# Patient Record
Sex: Female | Born: 1985 | Race: Black or African American | Hispanic: No | Smoking: Former smoker
Health system: Southern US, Community
[De-identification: ages and names within clinical notes are randomized; demographics above are authoritative.]

## PROBLEM LIST (undated history)

## (undated) DIAGNOSIS — E079 Disorder of thyroid, unspecified: Secondary | ICD-10-CM

## (undated) DIAGNOSIS — E049 Nontoxic goiter, unspecified: Secondary | ICD-10-CM

## (undated) DIAGNOSIS — M797 Fibromyalgia: Secondary | ICD-10-CM

## (undated) DIAGNOSIS — I10 Essential (primary) hypertension: Secondary | ICD-10-CM

## (undated) DIAGNOSIS — J45909 Unspecified asthma, uncomplicated: Secondary | ICD-10-CM

## (undated) DIAGNOSIS — K589 Irritable bowel syndrome without diarrhea: Secondary | ICD-10-CM

## (undated) HISTORY — DX: Nontoxic goiter, unspecified: E04.9

## (undated) HISTORY — DX: Unspecified asthma, uncomplicated: J45.909

## (undated) HISTORY — PX: OTHER SURGICAL HISTORY: SHX169

---

## 2021-09-09 ENCOUNTER — Emergency Department (HOSPITAL_COMMUNITY)
Admission: EM | Admit: 2021-09-09 | Discharge: 2021-09-10 | Disposition: A | Discharge status: Home / Self Care | Payer: 59 | Attending: Emergency Medicine | Admitting: Emergency Medicine

## 2021-09-09 ENCOUNTER — Other Ambulatory Visit: Payer: Self-pay

## 2021-09-09 ENCOUNTER — Encounter (HOSPITAL_COMMUNITY): Payer: Self-pay

## 2021-09-09 ENCOUNTER — Emergency Department (HOSPITAL_COMMUNITY): Payer: 59

## 2021-09-09 DIAGNOSIS — E876 Hypokalemia: Secondary | ICD-10-CM | POA: Diagnosis not present

## 2021-09-09 DIAGNOSIS — N939 Abnormal uterine and vaginal bleeding, unspecified: Secondary | ICD-10-CM | POA: Diagnosis present

## 2021-09-09 DIAGNOSIS — D649 Anemia, unspecified: Secondary | ICD-10-CM | POA: Insufficient documentation

## 2021-09-09 DIAGNOSIS — I10 Essential (primary) hypertension: Secondary | ICD-10-CM | POA: Insufficient documentation

## 2021-09-09 DIAGNOSIS — Z79899 Other long term (current) drug therapy: Secondary | ICD-10-CM | POA: Insufficient documentation

## 2021-09-09 DIAGNOSIS — R109 Unspecified abdominal pain: Secondary | ICD-10-CM | POA: Insufficient documentation

## 2021-09-09 HISTORY — DX: Irritable bowel syndrome, unspecified: K58.9

## 2021-09-09 HISTORY — DX: Fibromyalgia: M79.7

## 2021-09-09 HISTORY — DX: Essential (primary) hypertension: I10

## 2021-09-09 HISTORY — DX: Disorder of thyroid, unspecified: E07.9

## 2021-09-09 LAB — URINALYSIS, ROUTINE W REFLEX MICROSCOPIC
Bacteria, UA: NONE SEEN
Bilirubin Urine: NEGATIVE
Glucose, UA: >=500 mg/dL — AB
Ketones, ur: NEGATIVE mg/dL
Leukocytes,Ua: NEGATIVE
Nitrite: NEGATIVE
Protein, ur: 30 mg/dL — AB
RBC / HPF: >50 RBC/hpf — ABNORMAL HIGH (ref 0–5)
Specific Gravity, Urine: 1.011 (ref 1.005–1.030)
pH: 8 (ref 5.0–8.0)

## 2021-09-09 LAB — CBC WITH DIFFERENTIAL/PLATELET
Abs Immature Granulocytes: 0.02 10*3/uL (ref 0.00–0.07)
Basophils Absolute: 0.1 10*3/uL (ref 0.0–0.1)
Basophils Relative: 1 %
Eosinophils Absolute: 0.2 10*3/uL (ref 0.0–0.5)
Eosinophils Relative: 2 %
HCT: 32.8 % — ABNORMAL LOW (ref 36.0–46.0)
Hemoglobin: 10.7 g/dL — ABNORMAL LOW (ref 12.0–15.0)
Immature Granulocytes: 0 %
Lymphocytes Relative: 25 %
Lymphs Abs: 2.4 10*3/uL (ref 0.7–4.0)
MCH: 30.2 pg (ref 26.0–34.0)
MCHC: 32.6 g/dL (ref 30.0–36.0)
MCV: 92.7 fL (ref 80.0–100.0)
Monocytes Absolute: 0.5 10*3/uL (ref 0.1–1.0)
Monocytes Relative: 5 %
Neutro Abs: 6.5 10*3/uL (ref 1.7–7.7)
Neutrophils Relative %: 67 %
Platelets: 405 10*3/uL — ABNORMAL HIGH (ref 150–400)
RBC: 3.54 MIL/uL — ABNORMAL LOW (ref 3.87–5.11)
RDW: 12.8 % (ref 11.5–15.5)
WBC: 9.6 10*3/uL (ref 4.0–10.5)
nRBC: 0 % (ref 0.0–0.2)

## 2021-09-09 LAB — BASIC METABOLIC PANEL
Anion gap: 5 (ref 5–15)
BUN: 12 mg/dL (ref 6–20)
CO2: 27 mmol/L (ref 22–32)
Calcium: 8.7 mg/dL — ABNORMAL LOW (ref 8.9–10.3)
Chloride: 106 mmol/L (ref 98–111)
Creatinine, Ser: 0.78 mg/dL (ref 0.44–1.00)
GFR, Estimated: >60 mL/min (ref >60)
Glucose, Bld: 131 mg/dL — ABNORMAL HIGH (ref 70–99)
Potassium: 3 mmol/L — ABNORMAL LOW (ref 3.5–5.1)
Sodium: 138 mmol/L (ref 135–145)

## 2021-09-09 LAB — PREGNANCY, URINE: Preg Test, Ur: NEGATIVE

## 2021-09-09 LAB — WET PREP, GENITAL
Clue Cells Wet Prep HPF POC: NONE SEEN
Sperm: NONE SEEN
Trich, Wet Prep: NONE SEEN
WBC, Wet Prep HPF POC: <10 — AB
Yeast Wet Prep HPF POC: NONE SEEN

## 2021-09-09 LAB — TYPE AND SCREEN
ABO/RH(D): O POS
Antibody Screen: NEGATIVE

## 2021-09-09 MED ORDER — POTASSIUM CHLORIDE CRYS ER 20 MEQ PO TBCR
40.0000 meq | EXTENDED_RELEASE_TABLET | Freq: Once | ORAL | Status: AC
  Administered 2021-09-09: 40 meq via ORAL
  Filled 2021-09-09: qty 2

## 2021-09-09 MED ORDER — FENTANYL CITRATE PF 50 MCG/ML IJ SOSY: 25.0000 ug | PREFILLED_SYRINGE | Freq: Once | INTRAMUSCULAR | Status: DC

## 2021-09-09 MED ORDER — IBUPROFEN 800 MG PO TABS
800.0000 mg | ORAL_TABLET | Freq: Once | ORAL | Status: AC
  Administered 2021-09-09: 800 mg via ORAL
  Filled 2021-09-09: qty 1

## 2021-09-09 MED ORDER — SODIUM CHLORIDE 0.9 % IV BOLUS
1000.0000 mL | Freq: Once | INTRAVENOUS | Status: AC
  Administered 2021-09-09: 1000 mL via INTRAVENOUS

## 2021-09-09 NOTE — ED Provider Notes (Signed)
Emergency Medicine Provider Triage Evaluation Note  Bonnie Saunders , a 35 y.o. female  was evaluated in triage.  Pt complains of pelvic pain, vaginal bleeding. She states that she has been having bleeding since October 16th. This is her normal start date of her menstrual cycle, however it started with just spotting for 2 weeks followed by significant amounts of bleeding so much so that she is now wearing depends with a pad and having to change several times a day. Additionally, she states that she developed pelvic pain 1 week ago that is suprapubic in nature. She denies any history of similar episodes. She is not on birth control. She also endorses dizziness and lightheadedness over the past few days.  Review of Systems  Positive: Vaginal bleeding, pelvic pain Negative: Fevers, chills  Physical Exam  BP (!) 149/95 (BP Location: Left Arm)   Pulse 97   Temp 98.2 F (36.8 C) (Oral)   Resp 16   SpO2 98%  Gen:   Awake, no distress   Resp:  Normal effort  MSK:   Moves extremities without difficulty  Other:    Medical Decision Making  Medically screening exam initiated at 7:06 PM.  Appropriate orders placed.  Bonnie Saunders was informed that the remainder of the evaluation will be completed by another provider, this initial triage assessment does not replace that evaluation, and the importance of remaining in the ED until their evaluation is complete.     Bonnie Saunders 09/09/21 1910    Tegeler, Canary Brim, MD 09/10/21 Rich Fuchs

## 2021-09-09 NOTE — ED Provider Notes (Addendum)
Bonnie Saunders   CSN: [id number] Arrival date & time: 09/09/21  1802     History Chief Complaint  Patient presents with   Vaginal Bleeding    Bonnie Saunders is a 35 y.o. female who presents to the emergency department with a 1 month history of vaginal bleeding.  She is typically very regular and has her menstrual cycle in the middle of the month each month.  Last month she initially started with some vaginal spotting which has since progressed to large heavy menstrual bleeding with clots.  This has been constant over the last couple weeks.  She reports associated lightheadedness, nausea, and vomiting.  She also is complaining of lower abdominal pain characterized a cramping sensation.  She is not on any form of birth control and denies any chance of being pregnant as she is homosexual.  She denies any loss of consciousness, fever, chills, cough, congestion.  She has an appointment with her OB/GYN on the 17th and has a family history of leiomyomas.   Vaginal Bleeding     Past Medical History:  Diagnosis Date   Fibromyalgia    Hypertension    IBS (irritable bowel syndrome)    Thyroid condition     There are no problems to display for this patient.   History reviewed. No pertinent surgical history.   OB History   No obstetric history on file.     History reviewed. No pertinent family history.  Social History   Tobacco Use   Smoking status: Never   Smokeless tobacco: Never  Vaping Use   Vaping Use: Never used  Substance Use Topics   Alcohol use: Never   Drug use: Never    Home Medications Prior to Admission medications   Medication Sig Start Date End Date Taking? Authorizing Provider  albuterol (VENTOLIN HFA) 108 (90 Base) MCG/ACT inhaler Inhale 1 puff into the lungs every 4 (four) hours as needed for wheezing or shortness of breath. 02/07/18  Yes [provider]  metFORMIN (GLUCOPHAGE) 850 MG tablet Take 850  mg by mouth daily with breakfast. 02/27/21  Yes [provider]  Multiple Vitamins-Calcium (ONE-A-DAY WOMENS FORMULA) TABS Take 1 tablet by mouth daily.   Yes [provider]  Naproxen Sodium 220 MG CAPS Take 1 capsule by mouth every 12 (twelve) hours as needed (pain, headache).   Yes [provider]  omeprazole (PRILOSEC) 20 MG capsule Take 20 mg by mouth daily. 03/29/20  Yes [provider]  pregabalin (LYRICA) 50 MG capsule Take 50 mg by mouth 2 (two) times daily. 12/17/19  Yes [provider]  sertraline (ZOLOFT) 25 MG tablet Take 25 mg by mouth daily. 08/25/21  Yes [provider]  tiZANidine (ZANAFLEX) 4 MG tablet Take 4 mg by mouth at bedtime as needed for muscle spasms. 08/11/21  Yes [provider]  verapamil (CALAN-SR) 180 MG CR tablet Take 180 mg by mouth daily. 08/25/21  Yes [provider]    Allergies    Peanut (diagnostic), Shellfish allergy, Norco [hydrocodone-acetaminophen], Penicillins, and Skelaxin [metaxalone]  Review of Systems   Review of Systems  Genitourinary:  Positive for vaginal bleeding.  All other systems reviewed and are negative.  Physical Exam Updated Vital Signs BP 129/90   Pulse 84   Temp 98.2 F (36.8 C) (Oral)   Resp 18   SpO2 100%   Physical Exam Vitals and nursing Saunders reviewed. Exam conducted with a chaperone present.  Constitutional:  General: She is not in acute distress.    Appearance: Normal appearance.  HENT:     Head: Normocephalic and atraumatic.  Eyes:     General:        Right eye: No discharge.        Left eye: No discharge.  Cardiovascular:     Comments: Regular rate and rhythm.  S1/S2 are distinct without any evidence of murmur, rubs, or gallops.  Radial pulses are 2+ bilaterally.  Dorsalis pedis pulses are 2+ bilaterally.  No evidence of pedal edema. Pulmonary:     Comments: Clear to auscultation bilaterally.  Normal effort.  No respiratory distress.  No  evidence of wheezes, rales, or rhonchi heard throughout. Abdominal:     General: Abdomen is flat. Bowel sounds are normal. There is no distension.     Tenderness: There is no guarding or rebound.     Comments: Obese abdomen. Moderate lower abdominal tenderness.  Genitourinary:    Comments: External genitalia is normal.  No labial tenderness, erythema, or or edema.  No obvious lesions.  Vaginal canal was normal without any signs of tenderness, bleeding, or discharge.  There was a small clot at the cervical os. Mild amount of bleeding. Cervical os was nonfriable nonerythematous without any overt lesions or significant discharge.  Bimanual exam revealed right adnexal tenderness. Musculoskeletal:        General: Normal range of motion.     Cervical back: Neck supple.  Skin:    General: Skin is warm and dry.     Findings: No rash.  Neurological:     General: No focal deficit present.     Mental Status: She is alert.  Psychiatric:        Mood and Affect: Mood normal.        Behavior: Behavior normal.    ED Results / Procedures / Treatments   Labs (all labs ordered are listed, but only abnormal results are displayed) Labs Reviewed  WET PREP, GENITAL - Abnormal; Notable for the following components:      Result Value   WBC, Wet Prep HPF POC <10 (*)    All other components within normal limits  CBC WITH DIFFERENTIAL/PLATELET - Abnormal; Notable for the following components:   RBC 3.54 (*)    Hemoglobin 10.7 (*)    HCT 32.8 (*)    Platelets 405 (*)    All other components within normal limits  BASIC METABOLIC PANEL - Abnormal; Notable for the following components:   Potassium 3.0 (*)    Glucose, Bld 131 (*)    Calcium 8.7 (*)    All other components within normal limits  URINALYSIS, ROUTINE W REFLEX MICROSCOPIC - Abnormal; Notable for the following components:   APPearance HAZY (*)    Glucose, UA >=500 (*)    Hgb urine dipstick LARGE (*)    Protein, ur 30 (*)    RBC / HPF >50 (*)     All other components within normal limits  PREGNANCY, URINE  TYPE AND SCREEN  ABO/RH  GC/CHLAMYDIA PROBE AMP ([place]) NOT AT [facility]    EKG None  Radiology US Transvaginal Non-OB  Result Date: 09/09/2021 CLINICAL DATA:  Pelvic pain, continuous vaginal bleeding x1 month. LMP 08/13/2021 EXAM: TRANSABDOMINAL AND TRANSVAGINAL ULTRASOUND OF PELVIS DOPPLER ULTRASOUND OF OVARIES TECHNIQUE: Both transabdominal and transvaginal ultrasound examinations of the pelvis were performed. Transabdominal technique was performed for global imaging of the pelvis including uterus, ovaries, adnexal regions, and pelvic cul-de-sac. It was necessary  to proceed with endovaginal exam following the transabdominal exam to visualize the endometrium and ovaries. Color and duplex Doppler ultrasound was utilized to evaluate blood flow to the ovaries. COMPARISON:  None. FINDINGS: Uterus Measurements: 10.5 x 3.9 x 5.1 cm = volume: 108 mL. The cervix is expanded and there is heterogeneous relatively hypovascular material expanding the endocervical canal. The ovarian stroma is not well delineated and, together, un underlying hypovascular cervical mass should be considered. Alternatively, this may represent a combination of debris and blood product within the endocervical canal. The heterogeneous material does not appear to extend beyond the margin of the cervix on this limited evaluation. No intrauterine masses are seen. The uterus is anteverted. Endometrium Thickness: 16 mm.  No focal abnormality visualized. Right ovary Measurements: 4.6 x 3.4 x 4.0 cm = volume: 33 mL. A simple cyst is identified measuring 4.0 x 2.8 x 3.3 cm. Left ovary Measurements: 3.3 x 2.0 x 2.3 cm = volume: 8 mL. Normal appearance/no adnexal mass. Pulsed Doppler evaluation of both ovaries demonstrates normal low-resistance arterial and venous waveforms. Other findings Mild simple appearing free fluid noted within the pelvis. IMPRESSION: Heterogeneous,  avascular, expansile material within the endocervical canal. While this may simply represent a combination of blood and debris within the endocervical canal, a hypovascular mass could appear similarly. Correlation with clinical examination and possible tissue sampling is recommended. 4 cm right ovarian benign functional cyst. No follow-up imaging is recommended. Reference: Radiology 2019 Nov;293(2):359-371 Electronically Signed   By: Helyn Numbers M.D.   On: 09/09/2021 23:34   US Pelvis Complete  Result Date: 09/09/2021 CLINICAL DATA:  Pelvic pain, continuous vaginal bleeding x1 month. LMP 08/13/2021 EXAM: TRANSABDOMINAL AND TRANSVAGINAL ULTRASOUND OF PELVIS DOPPLER ULTRASOUND OF OVARIES TECHNIQUE: Both transabdominal and transvaginal ultrasound examinations of the pelvis were performed. Transabdominal technique was performed for global imaging of the pelvis including uterus, ovaries, adnexal regions, and pelvic cul-de-sac. It was necessary to proceed with endovaginal exam following the transabdominal exam to visualize the endometrium and ovaries. Color and duplex Doppler ultrasound was utilized to evaluate blood flow to the ovaries. COMPARISON:  None. FINDINGS: Uterus Measurements: 10.5 x 3.9 x 5.1 cm = volume: 108 mL. The cervix is expanded and there is heterogeneous relatively hypovascular material expanding the endocervical canal. The ovarian stroma is not well delineated and, together, un underlying hypovascular cervical mass should be considered. Alternatively, this may represent a combination of debris and blood product within the endocervical canal. The heterogeneous material does not appear to extend beyond the margin of the cervix on this limited evaluation. No intrauterine masses are seen. The uterus is anteverted. Endometrium Thickness: 16 mm.  No focal abnormality visualized. Right ovary Measurements: 4.6 x 3.4 x 4.0 cm = volume: 33 mL. A simple cyst is identified measuring 4.0 x 2.8 x 3.3 cm.  Left ovary Measurements: 3.3 x 2.0 x 2.3 cm = volume: 8 mL. Normal appearance/no adnexal mass. Pulsed Doppler evaluation of both ovaries demonstrates normal low-resistance arterial and venous waveforms. Other findings Mild simple appearing free fluid noted within the pelvis. IMPRESSION: Heterogeneous, avascular, expansile material within the endocervical canal. While this may simply represent a combination of blood and debris within the endocervical canal, a hypovascular mass could appear similarly. Correlation with clinical examination and possible tissue sampling is recommended. 4 cm right ovarian benign functional cyst. No follow-up imaging is recommended. Reference: Radiology 2019 Nov;293(2):359-371 Electronically Signed   By: Helyn Numbers M.D.   On: 09/09/2021 23:34   [place] Art/Ven Flow  Abd Pelv Doppler  Result Date: 09/09/2021 CLINICAL DATA:  Pelvic pain, continuous vaginal bleeding x1 month. LMP 08/13/2021 EXAM: TRANSABDOMINAL AND TRANSVAGINAL ULTRASOUND OF PELVIS DOPPLER ULTRASOUND OF OVARIES TECHNIQUE: Both transabdominal and transvaginal ultrasound examinations of the pelvis were performed. Transabdominal technique was performed for global imaging of the pelvis including uterus, ovaries, adnexal regions, and pelvic cul-de-sac. It was necessary to proceed with endovaginal exam following the transabdominal exam to visualize the endometrium and ovaries. Color and duplex Doppler ultrasound was utilized to evaluate blood flow to the ovaries. COMPARISON:  None. FINDINGS: Uterus Measurements: 10.5 x 3.9 x 5.1 cm = volume: 108 mL. The cervix is expanded and there is heterogeneous relatively hypovascular material expanding the endocervical canal. The ovarian stroma is not well delineated and, together, un underlying hypovascular cervical mass should be considered. Alternatively, this may represent a combination of debris and blood product within the endocervical canal. The heterogeneous material does not appear  to extend beyond the margin of the cervix on this limited evaluation. No intrauterine masses are seen. The uterus is anteverted. Endometrium Thickness: 16 mm.  No focal abnormality visualized. Right ovary Measurements: 4.6 x 3.4 x 4.0 cm = volume: 33 mL. A simple cyst is identified measuring 4.0 x 2.8 x 3.3 cm. Left ovary Measurements: 3.3 x 2.0 x 2.3 cm = volume: 8 mL. Normal appearance/no adnexal mass. Pulsed Doppler evaluation of both ovaries demonstrates normal low-resistance arterial and venous waveforms. Other findings Mild simple appearing free fluid noted within the pelvis. IMPRESSION: Heterogeneous, avascular, expansile material within the endocervical canal. While this may simply represent a combination of blood and debris within the endocervical canal, a hypovascular mass could appear similarly. Correlation with clinical examination and possible tissue sampling is recommended. 4 cm right ovarian benign functional cyst. No follow-up imaging is recommended. Reference: Radiology 2019 Nov;293(2):359-371 Electronically Signed   By: Helyn Numbers M.D.   On: 09/09/2021 23:34    Procedures Procedures   Medications Ordered in ED Medications  sodium chloride 0.9 % bolus 1,000 mL (1,000 mLs Intravenous New Bag/Given 09/09/21 2123)  ibuprofen (ADVIL) tablet 800 mg (800 mg Oral Given 09/09/21 2220)  potassium chloride SA (KLOR-CON) CR tablet 40 mEq (40 mEq Oral Given 09/09/21 2220)    ED Course  I have reviewed the triage vital signs and the nursing notes.  Pertinent labs & imaging results that were available during my care of the patient were reviewed by me and considered in my medical decision making (see chart for details).    MDM Rules/Calculators/A&P                          Bonnie Saunders is a 35 y.o. female who presents the emergency department with a 1 month history of vaginal bleeding.  Patient not tachycardic or hypotensive.  I have a low suspicion for ectopic pregnancy at this time. She  is otherwise well-appearing and stable.  Initial work-up was ordered in triage to include CBC, BMP, UA, pregnancy, type and screen, and pelvic ultrasound. Given her family history of leiomyomas certainly a possibility. I have a low suspicion for life-threatening coagulopathy, trauma or serious bacterial infection at this time.  CBC showed mild anemia.  CMP showed hypokalemia.  This was repleted in the emergency department today.  Pregnancy negative.  Wet prep without any abnormalities.  Urinalysis showed large amount of blood in the setting of vaginal bleeding. Orthostatics negative.  Attempted to give her a liter of fluid however  became clogged and the entire liter did not run.  She approximately 250 mL.  Patient does state that she is feeling somewhat better. Wet prep was negative. G/C is pending. I have a low suspicion for STI infection at this time. Shared decision making was done with patient and she wishes to defer treatment. I asked if she would like to stay to allow the bag of fluid to run patient wishes to go home. Pelvic US revealed 4 cm right ovarian cyst. No obvious cause of her bleeding.  Given the clinical scenario, I will have her follow-up with her OB/GYN on the 17th.  She is not in need of emergent transfusion at this time. Strict return precautions were given.  Instructed the patient to you take 600 mg of ibuprofen every 6 hours for the next several days to help with pelvic cramping and bleeding.  Patient and partner expressed full understanding.  All questions or concerns addressed.     Final Clinical Impression(s) / ED Diagnoses Final diagnoses:  Vaginal bleeding    Rx / DC Orders ED Discharge Orders     None        Teressa Lower, PA-C 09/10/21 0014    Teressa Lower, PA-C 09/10/21 Glena Norfolk    Mancel Bale, MD 09/10/21 [phone]

## 2021-09-09 NOTE — ED Triage Notes (Signed)
Pt c/o vaginal bleeding and pelvic pain since the 16th of November. States the bleeding has become more severe. Reports going through six pads and two depends today. States she is also experiencing increased lightheadedness and dizziness.

## 2021-09-10 NOTE — Discharge Instructions (Addendum)
Your ultrasound revealed you have a right-sided ovarian cyst.  Please take 600 mg ibuprofen every 6 hours for pelvic cramps and bleeding.  I would also start taking iron supplementation. Please keep your OB/GYN appointment for the 17th.  I would advise you call them on Monday deceiving get in sooner.  Please turn to the emergency department sooner if you experience worsening lightheadedness, trouble breathing, shortness of breath with exertion, loss of consciousness, worsening bleeding with severe clots, or any other concerns you might have.

## 2021-09-11 LAB — GC/CHLAMYDIA PROBE AMP (~~LOC~~) NOT AT ARMC
Chlamydia: NEGATIVE
Comment: NEGATIVE
Comment: NORMAL
Neisseria Gonorrhea: NEGATIVE

## 2022-03-04 ENCOUNTER — Emergency Department (HOSPITAL_COMMUNITY): Payer: 59

## 2022-03-04 ENCOUNTER — Encounter (HOSPITAL_COMMUNITY): Payer: Self-pay | Admitting: Emergency Medicine

## 2022-03-04 ENCOUNTER — Emergency Department (HOSPITAL_COMMUNITY)
Admission: EM | Admit: 2022-03-04 | Discharge: 2022-03-04 | Disposition: A | Discharge status: Home / Self Care | Payer: 59 | Attending: Emergency Medicine | Admitting: Emergency Medicine

## 2022-03-04 ENCOUNTER — Other Ambulatory Visit: Payer: Self-pay

## 2022-03-04 DIAGNOSIS — R000 Tachycardia, unspecified: Secondary | ICD-10-CM | POA: Insufficient documentation

## 2022-03-04 DIAGNOSIS — U071 COVID-19: Secondary | ICD-10-CM | POA: Diagnosis not present

## 2022-03-04 DIAGNOSIS — R059 Cough, unspecified: Secondary | ICD-10-CM | POA: Diagnosis present

## 2022-03-04 DIAGNOSIS — Z91010 Allergy to peanuts: Secondary | ICD-10-CM | POA: Insufficient documentation

## 2022-03-04 LAB — RESP PANEL BY RT-PCR (FLU A&B, COVID) ARPGX2
Influenza A by PCR: NEGATIVE
Influenza B by PCR: NEGATIVE
SARS Coronavirus 2 by RT PCR: POSITIVE — AB

## 2022-03-04 MED ORDER — ALBUTEROL SULFATE HFA 108 (90 BASE) MCG/ACT IN AERS
2.0000 | INHALATION_SPRAY | RESPIRATORY_TRACT | Status: DC | PRN
  Administered 2022-03-04: 2 via RESPIRATORY_TRACT
  Filled 2022-03-04: qty 6.7

## 2022-03-04 MED ORDER — SODIUM CHLORIDE 0.9 % IV BOLUS: 1000.0000 mL | Freq: Once | INTRAVENOUS | Status: DC

## 2022-03-04 MED ORDER — KETOROLAC TROMETHAMINE 15 MG/ML IJ SOLN
15.0000 mg | Freq: Once | INTRAMUSCULAR | Status: AC
  Administered 2022-03-04: 15 mg via INTRAMUSCULAR
  Filled 2022-03-04: qty 1

## 2022-03-04 MED ORDER — KETOROLAC TROMETHAMINE 15 MG/ML IJ SOLN: 15.0000 mg | Freq: Once | INTRAMUSCULAR | Status: DC

## 2022-03-04 MED ORDER — BENZONATATE 100 MG PO CAPS: 100.0000 mg | ORAL_CAPSULE | Freq: Three times a day (TID) | ORAL | 0 refills | Status: DC

## 2022-03-04 NOTE — ED Provider Notes (Addendum)
?[facility] EMERGENCY DEPARTMENT ?Provider Note ? ? ?CSN: [id number] ?Arrival date & time: 03/04/22  0441 ? ?  ? ?History ? ?Chief Complaint  ?Patient presents with  ? Shortness of Breath  ? ? ?Bonnie Saunders is a 36 y.o. female presenting due to flu symptoms.  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  Complaining of congestion, cold chills, cough, chest tightness and body aches.  She says it feels exactly like when she had the flu. ? ?  ?Home Medications ?Prior to Admission medications   ?Medication Sig Start Date End Date Taking? Authorizing Provider  ?albuterol (VENTOLIN HFA) 108 (90 Base) MCG/ACT inhaler Inhale 1 puff into the lungs every 4 (four) hours as needed for wheezing or shortness of breath. 02/07/18   [provider]  ?metFORMIN (GLUCOPHAGE) 850 MG tablet Take 850 mg by mouth daily with breakfast. 02/27/21   [provider]  ?Multiple Vitamins-Calcium (ONE-A-DAY WOMENS FORMULA) TABS Take 1 tablet by mouth daily.    [provider]  ?Naproxen Sodium 220 MG CAPS Take 1 capsule by mouth every 12 (twelve) hours as needed (pain, headache).    [provider]  ?omeprazole (PRILOSEC) 20 MG capsule Take 20 mg by mouth daily. 03/29/20   [provider]  ?pregabalin (LYRICA) 50 MG capsule Take 50 mg by mouth 2 (two) times daily. 12/17/19   [provider]  ?sertraline (ZOLOFT) 25 MG tablet Take 25 mg by mouth daily. 08/25/21   [provider]  ?tiZANidine (ZANAFLEX) 4 MG tablet Take 4 mg by mouth at bedtime as needed for muscle spasms. 08/11/21   [provider]  ?verapamil (CALAN-SR) 180 MG CR tablet Take 180 mg by mouth daily. 08/25/21   [provider]  ?   ? ?Allergies    ?Peanut (diagnostic), Shellfish allergy, Norco [hydrocodone-acetaminophen], Penicillins, and Skelaxin [metaxalone]   ? ?Review of Systems   ?Review of Systems  ?Constitutional:   Positive for chills. Negative for fever.  ?Respiratory:  Positive for chest tightness. Negative for shortness of breath.   ?Gastrointestinal:  Negative for diarrhea, nausea and vomiting.  ?Musculoskeletal:  Positive for myalgias.  ? ?Physical Exam ?Updated Vital Signs ?BP (!) 153/96 (BP Location: Right Arm)   Pulse (!) 112   Temp 98.8 ?F (37.1 ?C) (Oral)   Resp 18   Wt 102.5 kg   LMP 02/21/2022 (Approximate)   SpO2 99%  ?Physical Exam ?Vitals and nursing note reviewed.  ?Constitutional:   ?   General: She is not in acute distress. ?   Appearance: Normal appearance. She is diaphoretic. She is not ill-appearing.  ?HENT:  ?   Head: Normocephalic and atraumatic.  ?   Mouth/Throat:  ?   Mouth: Mucous membranes are moist.  ?   Pharynx: Oropharynx is clear.  ?Eyes:  ?   General: No scleral icterus. ?   Conjunctiva/sclera: Conjunctivae normal.  ?Cardiovascular:  ?   Rate and Rhythm: Regular rhythm. Tachycardia present.  ?Pulmonary:  ?   Effort: Pulmonary effort is normal. No respiratory distress.  ?   Breath sounds: No decreased breath sounds.  ?Skin: ?   General: Skin is warm.  ?   Findings: No rash.  ?Neurological:  ?   Mental Status: She is alert.  ?Psychiatric:     ?   Mood and Affect: Mood normal.  ? ? ?ED Results / Procedures / Treatments   ?Labs ?(all  labs ordered are listed, but only abnormal results are displayed) ?Labs Reviewed  ?RESP PANEL BY RT-PCR (FLU A&B, COVID) ARPGX2 - Abnormal; Notable for the following components:  ?    Result Value  ? SARS Coronavirus 2 by RT PCR POSITIVE (*)   ? All other components within normal limits  ? ? ?EKG ?None ? ?Radiology ?DG Chest 2 View ? ?Result Date: 03/04/2022 ?CLINICAL DATA:  COVID positive with shortness of breath, coughing and chest pain. EXAM: CHEST - 2 VIEW COMPARISON:  None Available. FINDINGS: The heart size and mediastinal contours are within normal limits. Both lungs are clear with mildly elevated right hemidiaphragm. The visualized skeletal structures are  unremarkable. IMPRESSION: No active cardiopulmonary disease. Electronically Signed   By: Telford Nab M.D.   On: 03/04/2022 05:34   ? ?Procedures ?Procedures  ? ? ?Medications Ordered in ED ?Medications  ?albuterol (VENTOLIN HFA) 108 (90 Base) MCG/ACT inhaler 2 puff (2 puffs Inhalation Given 03/04/22 0457)  ?ketorolac (TORADOL) 15 MG/ML injection 15 mg (has no administration in time range)  ? ? ?ED Course/ Medical Decision Making/ A&P ?  ?                        ?Medical Decision Making ?Amount and/or Complexity of Data Reviewed ?Radiology: ordered. ? ?Risk ?Prescription drug management. ? ? ?36 year old female presenting due to URI symptoms.  No known COVID exposure and she began to feel ill the following day. ? ?Imaging: Chest x-ray negative ? ?Testing: COVID-positive ? ?Treatment: Given albuterol inhaler and Toradol and says that this made her feel better ? ?MDM/disposition: Patient is stable for discharge home with a work note.  She is low likelihood Wells PE score.  Believe tachycardia and chest tightness is in the context of URI due to other symptoms and lack of risk factors.  She is agreeable to discharge with a work note and will not return to work until Friday. ? ?Strict return precautions were discussed and patient will return with worsening symptoms. ? ? ?Final Clinical Impression(s) / ED Diagnoses ?Final diagnoses:  ?COVID-19  ? ? ?Rx / DC Orders ?ED Discharge Orders   ? ?      Ordered  ?  benzonatate (TESSALON) 100 MG capsule  Every 8 hours       ? 03/04/22 0711  ? ?  ?  ? ?  ? ?Results and diagnoses were explained to the patient. Return precautions discussed in full. Patient had no additional questions and expressed complete understanding. ? ? ?This chart was dictated using voice recognition software.  Despite best efforts to proofread,  errors can occur which can change the documentation meaning.  ?  Rhae Hammock, PA-C ?03/04/22 [id number] ? ?Offered antiviral, declined need for additional treatment   ?  ?  ?Rhae Hammock, PA-C ?03/04/22 [id number] ? ?  ?Valarie Merino, MD ?03/06/22 1309 ? ?

## 2022-03-04 NOTE — Discharge Instructions (Signed)
Please use over-the-counter medications to treat your symptoms.  Mucinex is a great choice for congestion, Delsym may help with cough.  DayQuil and NyQuil are good options for overall flu symptoms. ? ?Be sure to stay hydrated and pick up the medication for cough as needed. ?

## 2022-03-04 NOTE — ED Triage Notes (Signed)
Cough (nonproductive), chest congestions, body aches, chest wall tenderness, and fever x 2 day. Positive Covid test at home. ?H/o fibromyalgia, prediabetes, htn, asthma (has used 4puffs from rescue inhaler today). ? ?

## 2022-07-27 ENCOUNTER — Ambulatory Visit
Admission: EM | Admit: 2022-07-27 | Discharge: 2022-07-27 | Disposition: A | Discharge status: Home / Self Care | Payer: 59 | Attending: Urgent Care | Admitting: Urgent Care

## 2022-07-27 DIAGNOSIS — Z7951 Long term (current) use of inhaled steroids: Secondary | ICD-10-CM | POA: Insufficient documentation

## 2022-07-27 DIAGNOSIS — J209 Acute bronchitis, unspecified: Secondary | ICD-10-CM | POA: Insufficient documentation

## 2022-07-27 DIAGNOSIS — Z20822 Contact with and (suspected) exposure to covid-19: Secondary | ICD-10-CM | POA: Insufficient documentation

## 2022-07-27 DIAGNOSIS — E119 Type 2 diabetes mellitus without complications: Secondary | ICD-10-CM | POA: Insufficient documentation

## 2022-07-27 DIAGNOSIS — Z7984 Long term (current) use of oral hypoglycemic drugs: Secondary | ICD-10-CM | POA: Diagnosis not present

## 2022-07-27 DIAGNOSIS — Z79899 Other long term (current) drug therapy: Secondary | ICD-10-CM | POA: Insufficient documentation

## 2022-07-27 MED ORDER — PROMETHAZINE-DM 6.25-15 MG/5ML PO SYRP: 2.5000 mL | ORAL_SOLUTION | Freq: Three times a day (TID) | ORAL | 0 refills | Status: DC | PRN

## 2022-07-27 MED ORDER — PREDNISONE 50 MG PO TABS: 50.0000 mg | ORAL_TABLET | Freq: Every day | ORAL | 0 refills | Status: DC

## 2022-07-27 NOTE — ED Provider Notes (Signed)
[facility]  Note:  This document was prepared using Systems analyst and may include unintentional dictation errors.  MRN: [id number] DOB: 08/11/1986  Subjective:   Bonnie Saunders is a 36 y.o. female presenting for 5-day history of acute onset persistent shortness of breath, chest tightness, chest pain and a dry cough.  Patient had an at-home negative COVID test.  Would like a repeat.  She does have a history of bronchitis and is seasonal.  She is not a smoker.  No history of asthma.  She is a type II diabetic treated without insulin.  No current facility-administered medications for this encounter.  Current Outpatient Medications:    albuterol (VENTOLIN HFA) 108 (90 Base) MCG/ACT inhaler, Inhale 1 puff into the lungs every 4 (four) hours as needed for wheezing or shortness of breath., Disp: , Rfl:    metFORMIN (GLUCOPHAGE) 850 MG tablet, Take 850 mg by mouth daily with breakfast., Disp: , Rfl:    Multiple Vitamins-Calcium (ONE-A-DAY WOMENS FORMULA) TABS, Take 1 tablet by mouth daily., Disp: , Rfl:    omeprazole (PRILOSEC) 20 MG capsule, Take 20 mg by mouth daily., Disp: , Rfl:    pregabalin (LYRICA) 50 MG capsule, Take 50 mg by mouth 2 (two) times daily., Disp: , Rfl:    sertraline (ZOLOFT) 25 MG tablet, Take 25 mg by mouth daily., Disp: , Rfl:    verapamil (CALAN-SR) 180 MG CR tablet, Take 180 mg by mouth daily., Disp: , Rfl:    benzonatate (TESSALON) 100 MG capsule, Take 1 capsule (100 mg total) by mouth every 8 (eight) hours., Disp: 21 capsule, Rfl: 0   Naproxen Sodium 220 MG CAPS, Take 1 capsule by mouth every 12 (twelve) hours as needed (pain, headache)., Disp: , Rfl:    tiZANidine (ZANAFLEX) 4 MG tablet, Take 4 mg by mouth at bedtime as needed for muscle spasms., Disp: , Rfl:    Allergies  Allergen Reactions   Peanut (Diagnostic) Anaphylaxis   Norco [Hydrocodone-Acetaminophen] Hives   Penicillins Hives   Skelaxin [Metaxalone] Hives     Past Medical History:  Diagnosis Date   Fibromyalgia    Hypertension    IBS (irritable bowel syndrome)    Thyroid condition      History reviewed. No pertinent surgical history.  History reviewed. No pertinent family history.  Social History   Tobacco Use   Smoking status: Never   Smokeless tobacco: Never  Vaping Use   Vaping Use: Never used  Substance Use Topics   Alcohol use: Never   Drug use: Never    ROS   Objective:   Vitals: BP (!) 149/90 (BP Location: Right Arm)   Pulse 85   Temp 98.3 F (36.8 C) (Oral)   Resp (!) 98   LMP 07/08/2022   Physical Exam Constitutional:      General: She is not in acute distress.    Appearance: Normal appearance. She is well-developed. She is obese. She is not ill-appearing, toxic-appearing or diaphoretic.  HENT:     Head: Normocephalic and atraumatic.     Nose: Nose normal.     Mouth/Throat:     Mouth: Mucous membranes are moist.  Eyes:     General: No scleral icterus.       Right eye: No discharge.        Left eye: No discharge.     Extraocular Movements: Extraocular movements intact.  Cardiovascular:     Rate and Rhythm: Normal rate and regular rhythm.  Heart sounds: Normal heart sounds. No murmur heard.    No friction rub. No gallop.  Pulmonary:     Effort: Pulmonary effort is normal. No respiratory distress.     Breath sounds: No stridor. No wheezing, rhonchi or rales.  Chest:     Chest wall: No tenderness.  Skin:    General: Skin is warm and dry.  Neurological:     General: No focal deficit present.     Mental Status: She is alert and oriented to person, place, and time.  Psychiatric:        Mood and Affect: Mood normal.        Behavior: Behavior normal.       Assessment and Plan :   PDMP not reviewed this encounter.  1. Acute bronchitis, unspecified organism   2. Type 2 diabetes mellitus treated without insulin (HCC)     Offered chest x-ray but patient declined.  Recommended oral  prednisone course for management of her bronchitis.  She declined refill for albuterol inhaler.  Use supportive care otherwise.  COVID 19 testing pending.  At this stage she does not qualify for COVID antivirals. Counseled patient on potential for adverse effects with medications prescribed/recommended today, ER and return-to-clinic precautions discussed, patient verbalized understanding.    Wallis Bamberg, [place] 07/27/22 1954

## 2022-07-27 NOTE — ED Triage Notes (Signed)
Sx started on Monday. Negative home COVID test. C/O shortness of breath and non productive cough.

## 2022-07-28 LAB — SARS CORONAVIRUS 2 (TAT 6-24 HRS): SARS Coronavirus 2: NEGATIVE

## 2022-11-25 ENCOUNTER — Emergency Department (HOSPITAL_BASED_OUTPATIENT_CLINIC_OR_DEPARTMENT_OTHER)
Admission: EM | Admit: 2022-11-25 | Discharge: 2022-11-25 | Disposition: A | Discharge status: Home / Self Care | Payer: BC Managed Care – PPO | Attending: Emergency Medicine | Admitting: Emergency Medicine

## 2022-11-25 ENCOUNTER — Other Ambulatory Visit: Payer: Self-pay

## 2022-11-25 ENCOUNTER — Emergency Department (HOSPITAL_BASED_OUTPATIENT_CLINIC_OR_DEPARTMENT_OTHER): Payer: BC Managed Care – PPO

## 2022-11-25 ENCOUNTER — Encounter (HOSPITAL_BASED_OUTPATIENT_CLINIC_OR_DEPARTMENT_OTHER): Payer: Self-pay | Admitting: Emergency Medicine

## 2022-11-25 DIAGNOSIS — I10 Essential (primary) hypertension: Secondary | ICD-10-CM | POA: Diagnosis not present

## 2022-11-25 DIAGNOSIS — Z91010 Allergy to peanuts: Secondary | ICD-10-CM | POA: Diagnosis not present

## 2022-11-25 DIAGNOSIS — R0789 Other chest pain: Secondary | ICD-10-CM | POA: Diagnosis present

## 2022-11-25 LAB — CBC
HCT: 38.8 % (ref 36.0–46.0)
Hemoglobin: 13.1 g/dL (ref 12.0–15.0)
MCH: 30 pg (ref 26.0–34.0)
MCHC: 33.8 g/dL (ref 30.0–36.0)
MCV: 89 fL (ref 80.0–100.0)
Platelets: 392 10*3/uL (ref 150–400)
RBC: 4.36 MIL/uL (ref 3.87–5.11)
RDW: 12.3 % (ref 11.5–15.5)
WBC: 8.7 10*3/uL (ref 4.0–10.5)
nRBC: 0 % (ref 0.0–0.2)

## 2022-11-25 LAB — TROPONIN I (HIGH SENSITIVITY)
Troponin I (High Sensitivity): 15 ng/L (ref <18)
Troponin I (High Sensitivity): 18 ng/L — ABNORMAL HIGH (ref <18)

## 2022-11-25 LAB — BASIC METABOLIC PANEL
Anion gap: 9 (ref 5–15)
BUN: 11 mg/dL (ref 6–20)
CO2: 24 mmol/L (ref 22–32)
Calcium: 9 mg/dL (ref 8.9–10.3)
Chloride: 102 mmol/L (ref 98–111)
Creatinine, Ser: 0.86 mg/dL (ref 0.44–1.00)
GFR, Estimated: >60 mL/min (ref >60)
Glucose, Bld: 119 mg/dL — ABNORMAL HIGH (ref 70–99)
Potassium: 3 mmol/L — ABNORMAL LOW (ref 3.5–5.1)
Sodium: 135 mmol/L (ref 135–145)

## 2022-11-25 LAB — PREGNANCY, URINE: Preg Test, Ur: NEGATIVE

## 2022-11-25 MED ORDER — ONDANSETRON HCL 4 MG/2ML IJ SOLN
4.0000 mg | Freq: Once | INTRAMUSCULAR | Status: AC
  Administered 2022-11-25: 4 mg via INTRAVENOUS
  Filled 2022-11-25: qty 2

## 2022-11-25 MED ORDER — POTASSIUM CHLORIDE CRYS ER 20 MEQ PO TBCR
40.0000 meq | EXTENDED_RELEASE_TABLET | Freq: Once | ORAL | Status: AC
  Administered 2022-11-25: 40 meq via ORAL
  Filled 2022-11-25: qty 2

## 2022-11-25 MED ORDER — IOHEXOL 350 MG/ML SOLN
75.0000 mL | Freq: Once | INTRAVENOUS | Status: AC | PRN
  Administered 2022-11-25: 75 mL via INTRAVENOUS

## 2022-11-25 MED ORDER — KETOROLAC TROMETHAMINE 60 MG/2ML IM SOLN
15.0000 mg | Freq: Once | INTRAMUSCULAR | Status: AC
  Administered 2022-11-25: 15 mg via INTRAMUSCULAR
  Filled 2022-11-25: qty 2

## 2022-11-25 MED ORDER — MORPHINE SULFATE (PF) 4 MG/ML IV SOLN
4.0000 mg | Freq: Once | INTRAVENOUS | Status: AC
  Administered 2022-11-25: 4 mg via INTRAVENOUS
  Filled 2022-11-25: qty 1

## 2022-11-25 NOTE — ED Notes (Signed)
Pt reported to registration that CP has worsened, reports sharp pain. Will repeat EKG

## 2022-11-25 NOTE — ED Provider Notes (Signed)
[facility] Provider Note   CSN: [id number] Arrival date & time: 11/25/22  1245     History  Chief Complaint  Patient presents with   Chest Pain    Bonnie Saunders is a 37 y.o. female.  37 year old female with past medical history significant for fibromyalgia presents today for evaluation of left-sided chest pain that radiates to left arm.  Denies history of CAD.  States she has had history of chest pain in the past which was associated with fibromyalgia.  She states this feels different given the severity, and duration.  She has had some associated shortness of breath as well.  Denies recent long travel, recent surgery, prior history of DVT or PE.  Denies lightheadedness, palpitations, diaphoresis.  The history is provided by the patient. No language interpreter was used.       Home Medications Prior to Admission medications   Medication Sig Start Date End Date Taking? Authorizing Provider  albuterol (VENTOLIN HFA) 108 (90 Base) MCG/ACT inhaler Inhale 1 puff into the lungs every 4 (four) hours as needed for wheezing or shortness of breath. 02/07/18   [provider]  metFORMIN (GLUCOPHAGE) 850 MG tablet Take 850 mg by mouth daily with breakfast. 02/27/21   [provider]  Multiple Vitamins-Calcium (ONE-A-DAY WOMENS FORMULA) TABS Take 1 tablet by mouth daily.    [provider]  omeprazole (PRILOSEC) 20 MG capsule Take 20 mg by mouth daily. 03/29/20   [provider]  predniSONE (DELTASONE) 50 MG tablet Take 1 tablet (50 mg total) by mouth daily with breakfast. 07/27/22   Jaynee Eagles, PA-C  pregabalin (LYRICA) 50 MG capsule Take 50 mg by mouth 2 (two) times daily. 12/17/19   [provider]  promethazine-dextromethorphan (PROMETHAZINE-DM) 6.25-15 MG/5ML syrup Take 2.5 mLs by mouth 3 (three) times daily as needed for cough. 07/27/22   Jaynee Eagles, PA-C  sertraline (ZOLOFT) 25 MG tablet Take 25 mg by  mouth daily. 08/25/21   [provider]  verapamil (CALAN-SR) 180 MG CR tablet Take 180 mg by mouth daily. 08/25/21   [provider]      Allergies    Peanut (diagnostic), Norco [hydrocodone-acetaminophen], Penicillins, and Skelaxin [metaxalone]    Review of Systems   Review of Systems  Constitutional:  Negative for chills and fever.  Respiratory:  Positive for shortness of breath. Negative for cough.   Cardiovascular:  Positive for chest pain. Negative for palpitations and leg swelling.  Gastrointestinal:  Negative for abdominal pain, nausea and vomiting.  Neurological:  Negative for light-headedness.  All other systems reviewed and are negative.   Physical Exam Updated Vital Signs BP (!) 168/105   Pulse 85   Temp 98.5 F (36.9 C) (Oral)   Resp 17   Ht 4\' 11"  (1.499 m)   Wt 106.6 kg   LMP 11/11/2022   SpO2 98%   BMI 47.46 kg/m  Physical Exam Vitals and nursing note reviewed.  Constitutional:      General: She is not in acute distress.    Appearance: Normal appearance. She is not ill-appearing.  HENT:     Head: Normocephalic and atraumatic.     Nose: Nose normal.  Eyes:     General: No scleral icterus.    Extraocular Movements: Extraocular movements intact.     Conjunctiva/sclera: Conjunctivae normal.  Cardiovascular:     Rate and Rhythm: Normal rate and regular rhythm.     Pulses: Normal pulses.  Pulmonary:  Effort: Pulmonary effort is normal. No respiratory distress.     Breath sounds: Normal breath sounds. No wheezing or rales.  Abdominal:     General: There is no distension.     Palpations: Abdomen is soft.     Tenderness: There is no abdominal tenderness. There is no guarding.  Musculoskeletal:        General: Normal range of motion.     Cervical back: Normal range of motion.     Right lower leg: No edema.     Left lower leg: No edema.  Skin:    General: Skin is warm and dry.  Neurological:     General: No focal deficit present.      Mental Status: She is alert and oriented to person, place, and time. Mental status is at baseline.     ED Results / Procedures / Treatments   Labs (all labs ordered are listed, but only abnormal results are displayed) Labs Reviewed  BASIC METABOLIC PANEL - Abnormal; Notable for the following components:      Result Value   Potassium 3.0 (*)    Glucose, Bld 119 (*)    All other components within normal limits  TROPONIN I (HIGH SENSITIVITY) - Abnormal; Notable for the following components:   Troponin I (High Sensitivity) 18 (*)    All other components within normal limits  CBC  PREGNANCY, URINE  TROPONIN I (HIGH SENSITIVITY)    EKG None  Radiology DG Chest 2 View  Result Date: 11/25/2022 CLINICAL DATA:  Chest pain. EXAM: CHEST - 2 VIEW COMPARISON:  Mar 04, 2022. FINDINGS: The heart size and mediastinal contours are within normal limits. Both lungs are clear. The visualized skeletal structures are unremarkable. IMPRESSION: No active cardiopulmonary disease. Electronically Signed   By: Lupita Raider M.D.   On: 11/25/2022 13:11    Procedures Procedures    Medications Ordered in ED Medications  potassium chloride SA (KLOR-CON M) CR tablet 40 mEq (has no administration in time range)  morphine (PF) 4 MG/ML injection 4 mg (has no administration in time range)  ondansetron (ZOFRAN) injection 4 mg (has no administration in time range)  ketorolac (TORADOL) injection 15 mg (15 mg Intramuscular Given 11/25/22 1415)  iohexol (OMNIPAQUE) 350 MG/ML injection 75 mL (75 mLs Intravenous Contrast Given 11/25/22 1625)    ED Course/ Medical Decision Making/ A&P                             Medical Decision Making Amount and/or Complexity of Data Reviewed Labs: ordered. Radiology: ordered.  Risk Prescription drug management.   Medical Decision Making / ED Course   This patient presents to the ED for concern of chest pain, shortness of breath, this involves an extensive number of  treatment options, and is a complaint that carries with it a high risk of complications and morbidity.  The differential diagnosis includes ACS, PE, pneumonia, pain related to fibromyalgia, MSK pain, pneumothorax, GERD  MDM: 37 year old female presents today for evaluation of above-mentioned symptoms.  She is overall well-appearing.  She was tachycardic earlier in triage.  Slightly tachypneic at [address] [place] the interview.  CBC is unremarkable.  BMP without acute concerns other than hypokalemia at 3.0.  Troponin initially 15 repeat 18.  Chest x-ray without acute cardiopulmonary process.  EKG without acute ischemic changes.  Low suspicion for ACS given troponin is relatively flat.  CT angio chest PE study without evidence of PE.  Although she was low risk for PE unable to [facility] out due to tachypnea, and tachycardia.  Shared decision making had inpatient prefer to proceed with CT angio.  She states she has history of hypokalemia but does not take a potassium supplement.  This could likely be MSK related.  However given recurrent chest pains and her history we will provide her ambulatory cardiology referral.  Patient is in agreement.  Return precaution discussed.  Patient voices understanding and is in agreement with plan.   Lab Tests: -I ordered, reviewed, and interpreted labs.   The pertinent results include:   Labs Reviewed  BASIC METABOLIC PANEL - Abnormal; Notable for the following components:      Result Value   Potassium 3.0 (*)    Glucose, Bld 119 (*)    All other components within normal limits  TROPONIN I (HIGH SENSITIVITY) - Abnormal; Notable for the following components:   Troponin I (High Sensitivity) 18 (*)    All other components within normal limits  CBC  PREGNANCY, URINE  TROPONIN I (HIGH SENSITIVITY)      EKG  EKG Interpretation  Date/Time:    Ventricular Rate:    PR Interval:    QRS Duration:   QT Interval:    QTC Calculation:   R Axis:     Text Interpretation:            Imaging Studies ordered: I ordered imaging studies including chest x-ray, CT angio PE study I independently visualized and interpreted imaging. I agree with the radiologist interpretation   Medicines ordered and prescription drug management: Meds ordered this encounter  Medications   ketorolac (TORADOL) injection 15 mg   potassium chloride SA (KLOR-CON M) CR tablet 40 mEq   morphine (PF) 4 MG/ML injection 4 mg   ondansetron (ZOFRAN) injection 4 mg   iohexol (OMNIPAQUE) 350 MG/ML injection 75 mL    -I have reviewed the patients home medicines and have made adjustments as needed  Critical interventions Pain medication   Cardiac Monitoring: The patient was maintained on a cardiac monitor.  I personally viewed and interpreted the cardiac monitored which showed an underlying rhythm of: Normal sinus rhythm   Reevaluation: After the interventions noted above, I reevaluated the patient and found that they have :improved  Co morbidities that complicate the patient evaluation  Past Medical History:  Diagnosis Date   Fibromyalgia    Hypertension    IBS (irritable bowel syndrome)    Thyroid condition       Dispostion: Patient is appropriate for discharge.  Discharged in stable condition.  Cardiology referral provided admission considered however patient with improvement in pain, and no emergent cause of chest pain identified.  Final Clinical Impression(s) / ED Diagnoses Final diagnoses:  Atypical chest pain    Rx / DC Orders ED Discharge Orders          Ordered    Ambulatory referral to Cardiology       Comments: If you have not heard from the Cardiology office within the next 72 hours please call [phone].   11/25/22 1724              Evlyn Courier, PA-C 11/25/22 1724    Tegeler, Gwenyth Allegra, MD 11/25/22 1743

## 2022-11-25 NOTE — ED Triage Notes (Signed)
Pt arrives pov with c/o LT side CP radiating to LT arm. Endorses shob.denies cough. Endorses 1g tylenol pta

## 2022-11-25 NOTE — ED Notes (Signed)
D/c paperwork reviewed with pt, including follow up care.  All questions and/or concerns addressed at time of d/c.  No further needs expressed. . Pt verbalized understanding, Ambulatory without assistance to ED exit, NAD.   

## 2022-11-25 NOTE — Discharge Instructions (Signed)
Your workup today is overall reassuring.  No concerning cause of your chest pain identified.  However given you are having these episodes more frequently I am given you a referral to cardiology.  This could be related to your fibromyalgia however we will give you this referral to ensure there is no concerning cause from a cardiac standpoint.  Otherwise continue taking your ibuprofen for any concerning symptoms return to the emergency department otherwise please follow-up with cardiology and your primary care provider.  Cardiology office should call you within the next couple days if you do not hear from them please call their office to schedule this appointment.

## 2022-11-26 DIAGNOSIS — I10 Essential (primary) hypertension: Secondary | ICD-10-CM | POA: Insufficient documentation

## 2022-11-26 DIAGNOSIS — R072 Precordial pain: Secondary | ICD-10-CM | POA: Insufficient documentation

## 2022-11-26 NOTE — Progress Notes (Unsigned)
Cardiology Office Note   Date:  11/27/2022   ID:  Bonnie Saunders, DOB Apr 19, 1986, MRN [id number]  PCP:  Eulah Pont, MD  Cardiologist:   None Referring:  ED  Chief Complaint  Patient presents with   Chest Pain      History of Present Illness: Bonnie Saunders is a 37 y.o. female who presents for evaluation of chest pain.  She was in the ED for this.    I reviewed these records for this visit.    Her potassium was 3.0.   She otherwise does not have a past cardiac history.  She does have risk factors with borderline blood sugars and hypertension.  She has never had any prior cardiac testing.  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  With this level of activity she does not get any cardiovascular symptoms.  The day she had the discomfort she was seen at church.  The sharp mid chest discomfort.  There was a little radiation to her shoulder.  A little lightheaded and short of breath.  Initially when she pressed on it she felt uncomfortable.  It eventually went away.  In the emergency room there were no ischemic changes.  CT was done which ruled out pulmonary embolism.  There is no mention of coronary calcium.  EKG was unremarkable.  She is otherwise not had the symptoms.  Was not having number for this.  She has been having some ongoing mild discomfort.   Past Medical History:  Diagnosis Date   Asthma    Fibromyalgia    Goiter    Hypertension    IBS (irritable bowel syndrome)     Past Surgical History:  Procedure Laterality Date   None       Current Outpatient Medications  Medication Sig Dispense Refill   albuterol (VENTOLIN HFA) 108 (90 Base) MCG/ACT inhaler Inhale 1 puff into the lungs every 4 (four) hours as needed for wheezing or shortness of breath.     metFORMIN (GLUCOPHAGE) 850 MG tablet Take 850 mg by mouth daily with breakfast.     Multiple Vitamins-Calcium (ONE-A-DAY WOMENS FORMULA) TABS Take 1  tablet by mouth daily.     omeprazole (PRILOSEC) 20 MG capsule Take 20 mg by mouth daily.     predniSONE (DELTASONE) 50 MG tablet Take 1 tablet (50 mg total) by mouth daily with breakfast. 5 tablet 0   pregabalin (LYRICA) 50 MG capsule Take 50 mg by mouth 2 (two) times daily.     promethazine-dextromethorphan (PROMETHAZINE-DM) 6.25-15 MG/5ML syrup Take 2.5 mLs by mouth 3 (three) times daily as needed for cough. 100 mL 0   sertraline (ZOLOFT) 25 MG tablet Take 25 mg by mouth daily.     verapamil (CALAN-SR) 180 MG CR tablet Take 180 mg by mouth daily.     No current facility-administered medications for this visit.    Allergies:   Acetaminophen-caffeine, Meloxicam, Peanut (diagnostic), Peanut oil, Fish allergy, Other, Germanium, Norco [hydrocodone-acetaminophen], Penicillins, and Skelaxin [metaxalone]    Social History:  The patient  reports that she has quit smoking. Her smoking use included cigarettes. She has never used smokeless tobacco. She reports current alcohol use. She reports that she does not use drugs.   Family History:  The patient's family history includes Diabetes in her father and mother; Hypertension in her father and mother.    ROS:  Please see the history of present illness.  Otherwise, review of systems are positive for none.   All other systems are reviewed and negative.    PHYSICAL EXAM: VS:  BP (!) 142/100 (BP Location: Left Arm, Patient Position: Sitting, Cuff Size: Large)   Pulse (!) 105   Ht 4\' 11"  (1.499 m)   Wt 217 lb 9.6 oz (98.7 kg)   LMP 11/11/2022   SpO2 95%   BMI 43.95 kg/m  , BMI Body mass index is 43.95 kg/m. GENERAL:  Well appearing HEENT:  Pupils equal round and reactive, fundi not visualized, oral mucosa unremarkable NECK:  No jugular venous distention, waveform within normal limits, carotid upstroke brisk and symmetric, no bruits, no thyromegaly LYMPHATICS:  No cervical, inguinal adenopathy LUNGS:  Clear to auscultation bilaterally BACK:  No  CVA tenderness CHEST:  Unremarkable HEART:  PMI not displaced or sustained,S1 and S2 within normal limits, no S3, no S4, no clicks, no rubs, no murmurs ABD:  Flat, positive bowel sounds normal in frequency in pitch, no bruits, no rebound, no guarding, no midline pulsatile mass, no hepatomegaly, no splenomegaly EXT:  2 plus pulses throughout, no edema, no cyanosis no clubbing SKIN:  No rashes no nodules NEURO:  Cranial nerves II through XII grossly intact, motor grossly intact throughout PSYCH:  Cognitively intact, oriented to person place and time    EKG:  EKG is not ordered today. The ekg ordered 11/25/2022 demonstrates sinus rhythm, rate 91, axis within normal limits, intervals within normal limits, no acute ST-T wave changes.   Recent Labs: 11/25/2022: BUN 11; Creatinine, Ser 0.86; Hemoglobin 13.1; Platelets 392; Potassium 3.0; Sodium 135    Lipid Panel No results found for: "CHOL", "TRIG", "HDL", "CHOLHDL", "VLDL", "LDLCALC", "LDLDIRECT"    Wt Readings from Last 3 Encounters:  11/27/22 217 lb 9.6 oz (98.7 kg)  11/25/22 235 lb (106.6 kg)  03/04/22 226 lb (102.5 kg)      Other studies Reviewed: Additional studies/ records that were reviewed today include: ED records. Review of the above records demonstrates:  Please see elsewhere in the note.     ASSESSMENT AND PLAN:  Chest pain: The chest discomfort is not anginal.  There was no objective evidence of ischemia.  No further cardiac workup is suggested.  I might suggest follow-up with GI if this persist.  HTN: The blood pressure is mildly elevated.  This is unusual and she says it is lower at home.  She is going to continue with weight loss and keep a blood pressure diary.  I applauded her weight loss and we discussed low carbohydrates.  We discussed physical activity.  Snoring: Her STOP-BANG score is at least 4.  And fairly convinced she has sleep apnea.  She will have an in lab sleep study.  Between this and weight loss we  can then follow her blood pressure and see if she needs addition of another medication.   Current medicines are reviewed at length with the patient today.  The patient does not have concerns regarding medicines.  The following changes have been made:  no change  Labs/ tests ordered today include: None  Orders Placed This Encounter  Procedures   Basic metabolic panel   Split night study     Disposition:   FU with me as needed.     Signed, Minus Breeding, MD  11/27/2022 8:49 AM    [place]

## 2022-11-27 ENCOUNTER — Ambulatory Visit: Payer: BC Managed Care – PPO | Attending: Cardiology | Admitting: Cardiology

## 2022-11-27 ENCOUNTER — Encounter: Payer: Self-pay | Admitting: Cardiology

## 2022-11-27 VITALS — BP 142/100 | HR 105 | Ht 59.0 in | Wt 217.6 lb

## 2022-11-27 DIAGNOSIS — R072 Precordial pain: Secondary | ICD-10-CM | POA: Diagnosis not present

## 2022-11-27 DIAGNOSIS — I10 Essential (primary) hypertension: Secondary | ICD-10-CM

## 2022-11-27 DIAGNOSIS — R0683 Snoring: Secondary | ICD-10-CM

## 2022-11-27 NOTE — Patient Instructions (Signed)
  Testing/Procedures:  Your physician has recommended that you have a sleep study. This test records several body functions during sleep, including: brain activity, eye movement, oxygen and carbon dioxide blood levels, heart rate and rhythm, breathing rate and rhythm, the flow of air through your mouth and nose, snoring, body muscle movements, and chest and belly movement. [place]   Follow-Up: At [facility], you and your health needs are our priority.  As part of our continuing mission to provide you with exceptional heart care, we have created designated Provider Care Teams.  These Care Teams include your primary Cardiologist (physician) and Advanced Practice Providers (APPs -  Physician Assistants and Nurse Practitioners) who all work together to provide you with the care you need, when you need it.  We recommend signing up for the patient portal called "MyChart".  Sign up information is provided on this After Visit Summary.  MyChart is used to connect with patients for Virtual Visits (Telemedicine).  Patients are able to view lab/test results, encounter notes, upcoming appointments, etc.  Non-urgent messages can be sent to your provider as well.   To learn more about what you can do with MyChart, go to [email or web address].    Your next appointment:    AS NEEDED

## 2022-11-28 ENCOUNTER — Other Ambulatory Visit: Payer: Self-pay | Admitting: *Deleted

## 2022-11-28 ENCOUNTER — Telehealth: Payer: Self-pay | Admitting: *Deleted

## 2022-11-28 DIAGNOSIS — R0683 Snoring: Secondary | ICD-10-CM

## 2022-11-28 LAB — BASIC METABOLIC PANEL
BUN/Creatinine Ratio: 11 (ref 9–23)
BUN: 9 mg/dL (ref 6–20)
CO2: 23 mmol/L (ref 20–29)
Calcium: 9.5 mg/dL (ref 8.7–10.2)
Chloride: 105 mmol/L (ref 96–106)
Creatinine, Ser: 0.81 mg/dL (ref 0.57–1.00)
Glucose: 100 mg/dL — ABNORMAL HIGH (ref 70–99)
Potassium: 4.5 mmol/L (ref 3.5–5.2)
Sodium: 143 mmol/L (ref 134–144)
eGFR: 96 mL/min/{1.73_m2} (ref >59)

## 2022-11-28 NOTE — Telephone Encounter (Signed)
Prior Authorization for split night sent to [employer] via web portal. Denied. HST approved. Order Number [id number]. Valid dates 11/28/22 to 01/26/23.

## 2022-12-21 ENCOUNTER — Ambulatory Visit (HOSPITAL_BASED_OUTPATIENT_CLINIC_OR_DEPARTMENT_OTHER): Payer: BC Managed Care – PPO | Attending: Cardiology | Admitting: Cardiovascular Disease

## 2022-12-21 DIAGNOSIS — G4733 Obstructive sleep apnea (adult) (pediatric): Secondary | ICD-10-CM | POA: Diagnosis not present

## 2022-12-21 DIAGNOSIS — R0683 Snoring: Secondary | ICD-10-CM

## 2022-12-21 DIAGNOSIS — G4719 Other hypersomnia: Secondary | ICD-10-CM | POA: Insufficient documentation

## 2022-12-28 ENCOUNTER — Encounter: Payer: Self-pay | Admitting: Cardiology

## 2022-12-29 ENCOUNTER — Encounter (HOSPITAL_BASED_OUTPATIENT_CLINIC_OR_DEPARTMENT_OTHER): Payer: Self-pay | Admitting: Cardiovascular Disease

## 2022-12-29 NOTE — Procedures (Signed)
      Patient Name: Bonnie Saunders, Bonnie Saunders Date: 12/21/2022 Gender: Female D.O.B: 07-Sep-1986 Age (years): 36 Referring Provider: Minus Breeding Height (inches): 39 Interpreting Physician: Shelva Majestic MD, ABSM Weight (lbs): 220 RPSGT: Jacolyn Reedy BMI: 44 MRN: [id number] Neck Size: 14.00  CLINICAL INFORMATION Sleep Study Type: HST  Indication for sleep study: snoring, daytime sleepiness, STOP-Bang 4  Epworth Sleepiness Score: 13  SLEEP STUDY TECHNIQUE A multi-channel overnight portable sleep study was performed. The channels recorded were: nasal airflow, thoracic respiratory movement, and oxygen saturation with a pulse oximetry. Snoring was also monitored.  MEDICATIONS albuterol (VENTOLIN HFA) 108 (90 Base) MCG/ACT inhaler metFORMIN (GLUCOPHAGE) 850 MG tablet Multiple Vitamins-Calcium (ONE-A-DAY WOMENS FORMULA) TABS omeprazole (PRILOSEC) 20 MG capsule predniSONE (DELTASONE) 50 MG tablet pregabalin (LYRICA) 50 MG capsule promethazine-dextromethorphan (PROMETHAZINE-DM) 6.25-15 MG/5ML syrup sertraline (ZOLOFT) 25 MG tablet verapamil (CALAN-SR) 180 MG CR tabl Patient self administered medications include: N/A.  SLEEP ARCHITECTURE Patient was studied for 398 minutes. The sleep efficiency was 100.0 % and the patient was supine for 0%. The arousal index was 0.0 per hour.  RESPIRATORY PARAMETERS The overall AHI was 16.1 per hour, with a central apnea index of 0 per hour.  The oxygen nadir was 80% during sleep. Time spent < 89% was 2.3 minutes.  CARDIAC DATA Mean heart rate during sleep was 69.2 bpm.  Heart rate range: 51 - 113 bpm.  IMPRESSIONS - Moderate obstructive sleep apnea occurred during this study (AHI 16.1/h). - Moderately severe oxygen desaturation to a nadir of 80%. - Patient snored 60.7 minutes (15.3%) during the sleep.  DIAGNOSIS - Obstructive Sleep Apnea (G47.33)  RECOMMENDATIONS - Therapeutic CPAP for treatment of the patient's moderate sleep  disordered breathing. If unable to obtain an in-lab CPAP titration, initiate Auto -PAP with EPR of 3 at 6 - 16 cm of water. - Effort should be made to optimize nasal and oropharyngeal patency. - Positional therapy avoiding supine position during sleep. - Avoid alcohol, sedatives and other CNS depressants that may worsen sleep apnea and disrupt normal sleep architecture. - Sleep hygiene should be reviewed to assess factors that may improve sleep quality. - Weight management (BMI 44) and regular exercise should be initiated or continued. - Recommend a download and sleep clinic evalyuation after 4 - 6 weeks of therapy  [Electronically signed] 12/29/2022 10:39 AM  Shelva Majestic MD, [facility], ABSM Diplomate, American Board of Sleep Medicine  NPI: [id number]  [place] PH: [phone]   FX: [phone] [place]

## 2023-01-01 ENCOUNTER — Telehealth: Payer: Self-pay | Admitting: *Deleted

## 2023-01-01 ENCOUNTER — Other Ambulatory Visit: Payer: Self-pay | Admitting: Cardiovascular Disease

## 2023-01-01 DIAGNOSIS — G4733 Obstructive sleep apnea (adult) (pediatric): Secondary | ICD-10-CM

## 2023-01-01 NOTE — Telephone Encounter (Signed)
Patient notified of HST results and recommendations. She agrees to proceed with CPAP titration pending insurance approval. Patient had no questions.

## 2023-01-03 ENCOUNTER — Telehealth: Payer: Self-pay | Admitting: *Deleted

## 2023-01-03 NOTE — Telephone Encounter (Signed)
Prior Authorization for CPAP titration sent to [facility] via web portal. Auth Number [id number]. Valid dates 01/03/23 to 03/03/23.

## 2023-01-21 ENCOUNTER — Ambulatory Visit (HOSPITAL_BASED_OUTPATIENT_CLINIC_OR_DEPARTMENT_OTHER): Payer: BC Managed Care – PPO | Attending: Cardiovascular Disease | Admitting: Cardiovascular Disease

## 2023-01-21 VITALS — Ht 59.0 in | Wt 230.0 lb

## 2023-01-21 DIAGNOSIS — G4733 Obstructive sleep apnea (adult) (pediatric): Secondary | ICD-10-CM | POA: Diagnosis not present

## 2023-01-24 IMAGING — US US PELVIS COMPLETE
1 series · 13 of 25 positions shown · non-contrast
Comparison: None.

CLINICAL DATA: Pelvic pain, continuous vaginal bleeding x1 month.
LMP 08/13/2021

EXAM:
TRANSABDOMINAL AND TRANSVAGINAL ULTRASOUND OF PELVIS
DOPPLER ULTRASOUND OF OVARIES
TECHNIQUE: Both transabdominal and transvaginal ultrasound examinations of the
pelvis were performed. Transabdominal technique was performed for
global imaging of the pelvis including uterus, ovaries, adnexal
regions, and pelvic cul-de-sac.
It was necessary to proceed with endovaginal exam following the
transabdominal exam to visualize the endometrium and ovaries. Color
and duplex Doppler ultrasound was utilized to evaluate blood flow to
the ovaries.

[Series 1: us pelvis complete · 13 of 80 slices shown]
[im 1/80]
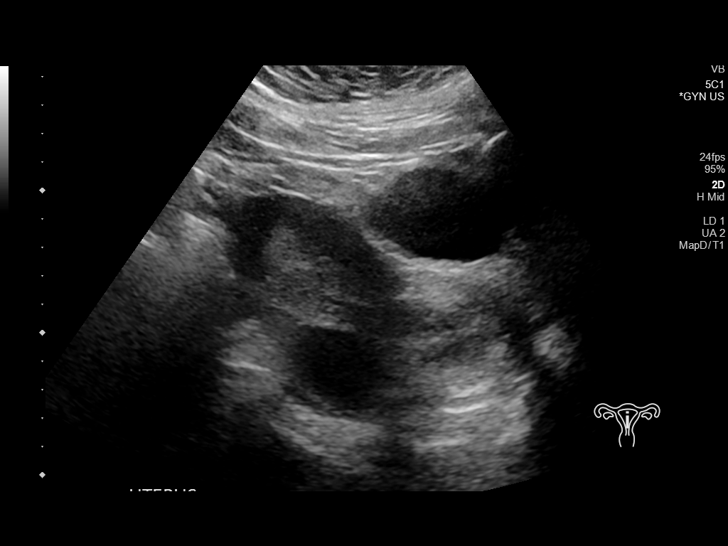
[im 7/80]
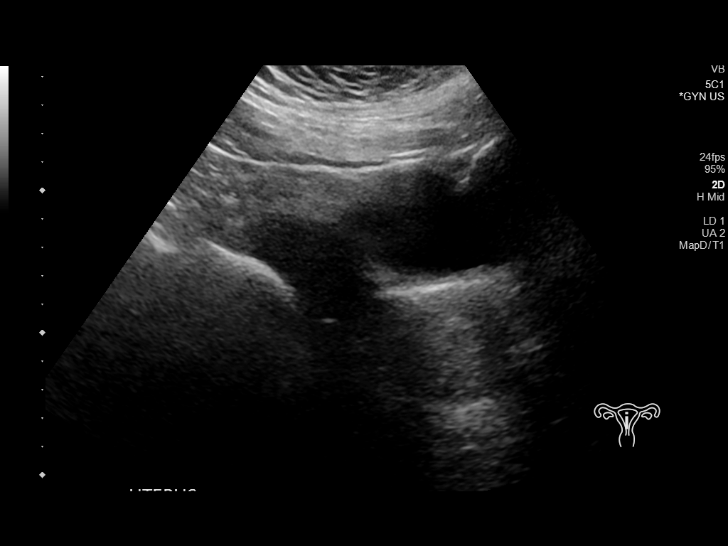
[im 14/80]
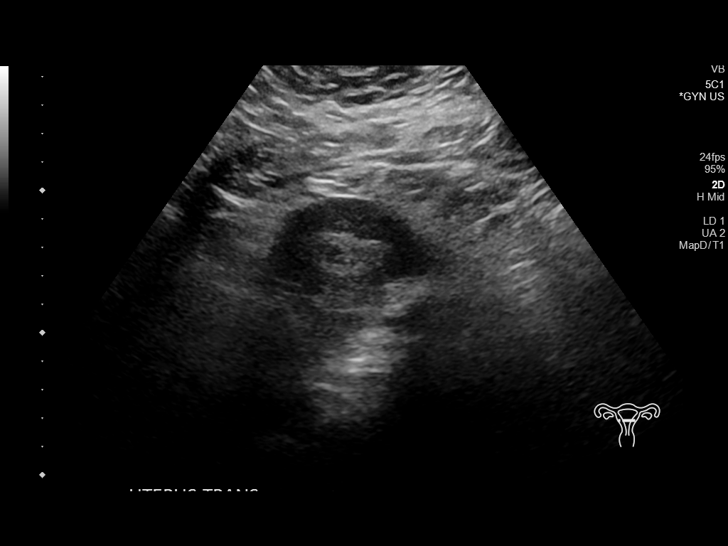
[im 20/80]
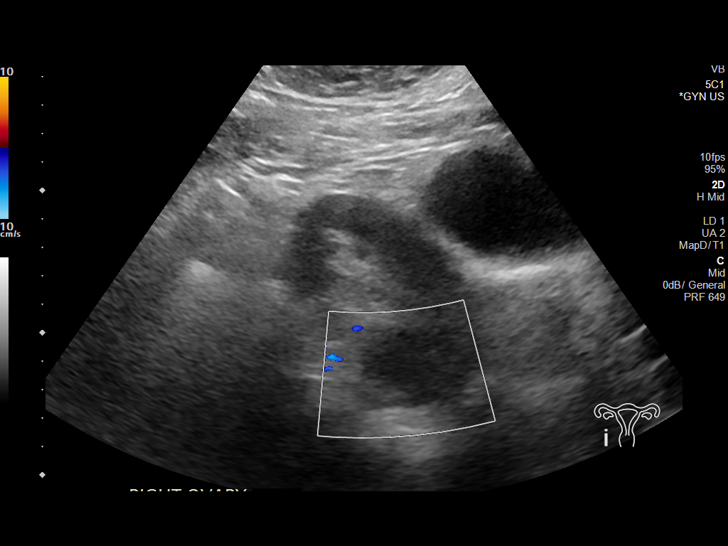
[im 27/80]
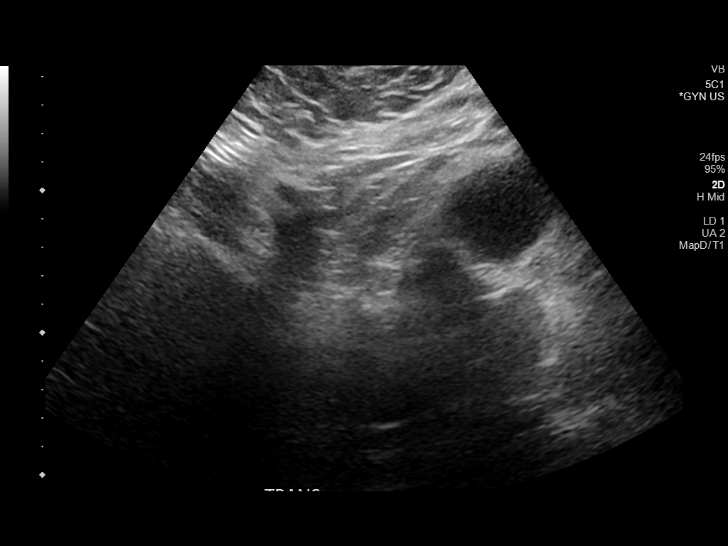
[im 33/80]
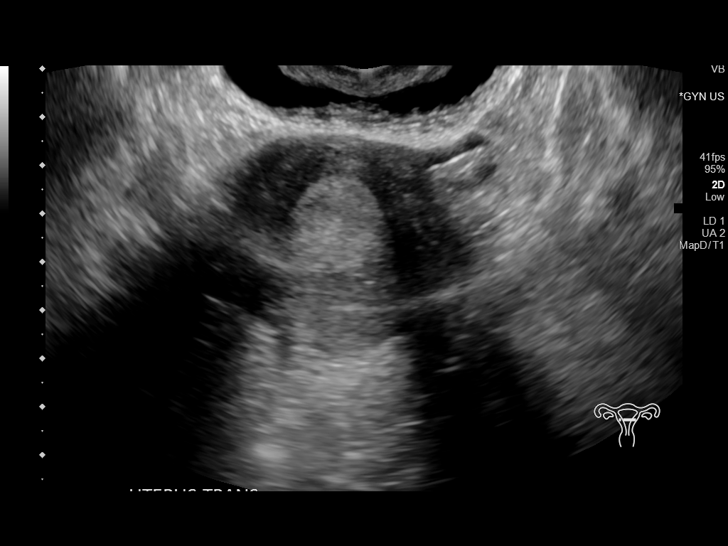
[im 40/80]
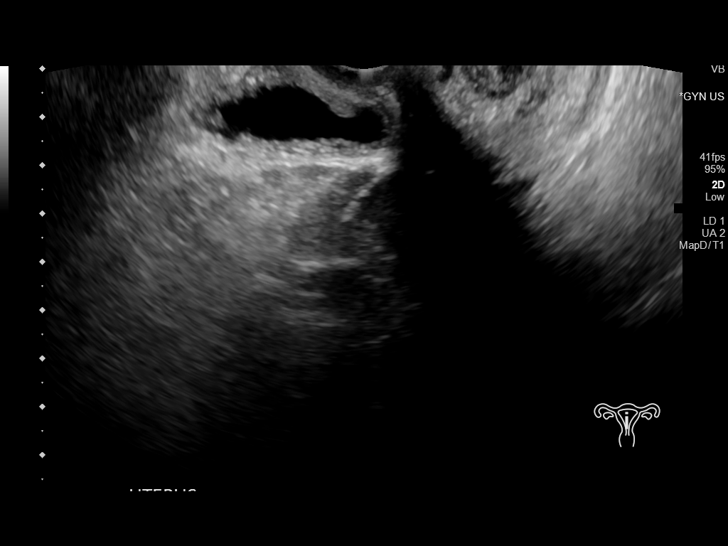
[im 47/80]
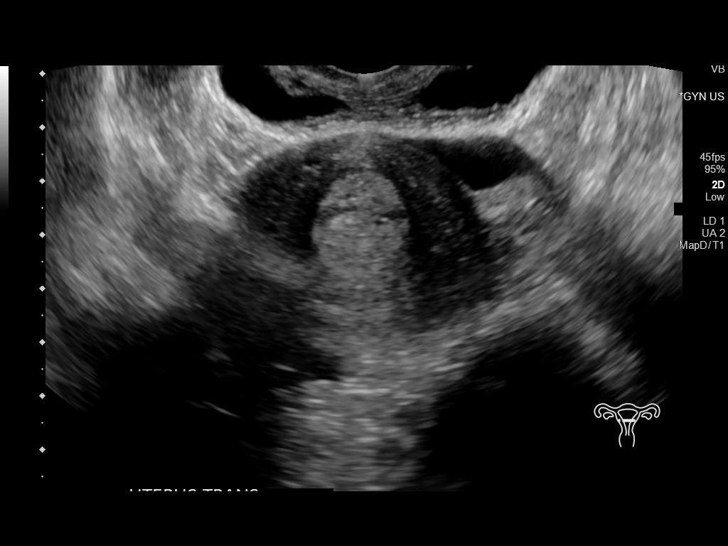
[im 53/80]
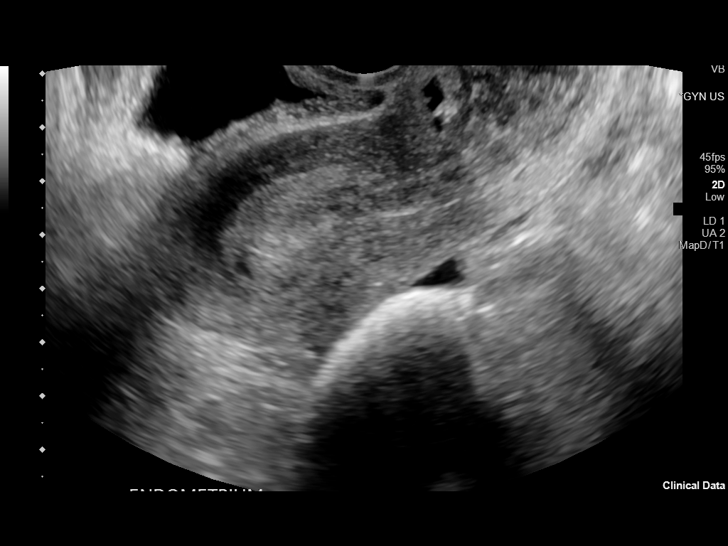
[im 60/80]
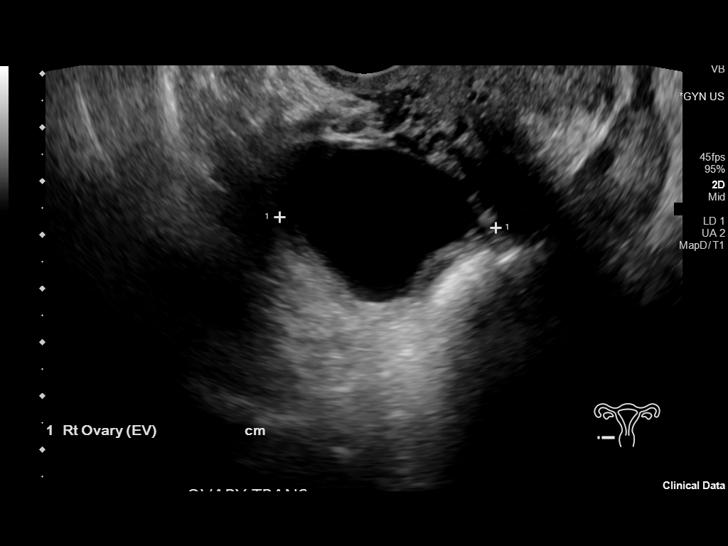
[im 66/80]
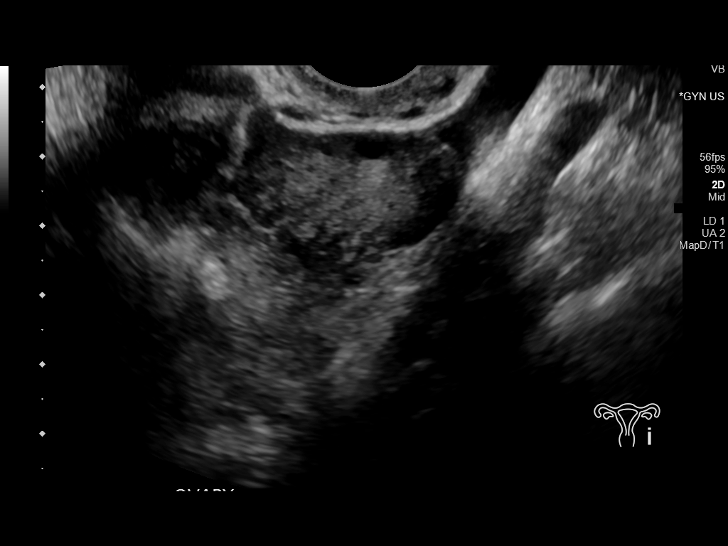
[im 73/80]
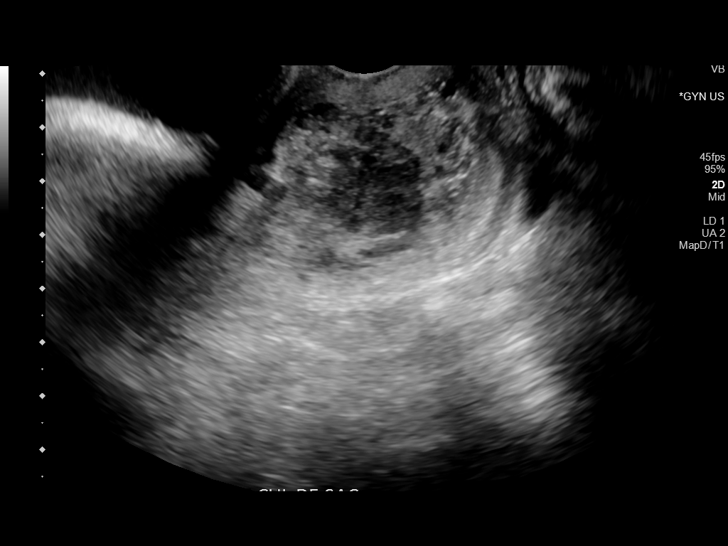
[im 80/80]
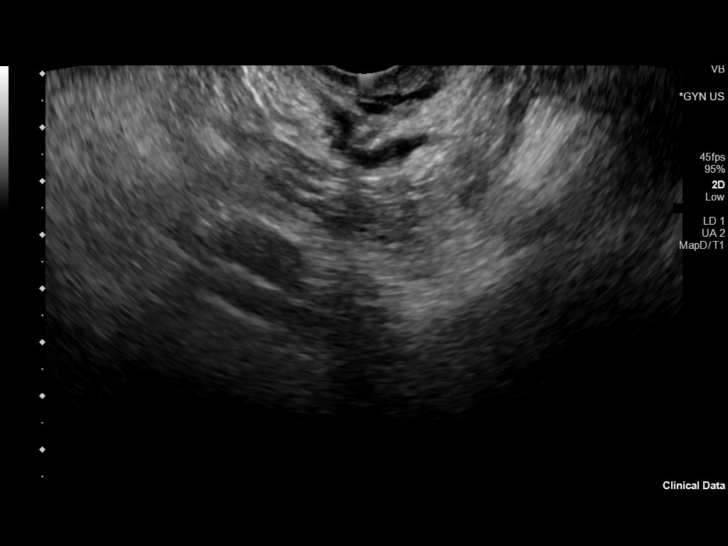

[13 of 25 positions shown; findings below may reference images not displayed]

FINDINGS: Uterus

Measurements: 10.5 x 3.9 x 5.1 cm = volume: 108 mL. The cervix is
expanded and there is heterogeneous relatively hypovascular material
expanding the endocervical canal. The ovarian stroma is not well
delineated and, together, un underlying hypovascular cervical mass
should be considered. Alternatively, this may represent a
combination of debris and blood product within the endocervical
canal. The heterogeneous material does not appear to extend beyond
the margin of the cervix on this limited evaluation. No intrauterine
masses are seen. The uterus is anteverted.

Endometrium

Thickness: 16 mm.  No focal abnormality visualized.

Right ovary

Measurements: 4.6 x 3.4 x 4.0 cm = volume: 33 mL. A simple cyst is
identified measuring 4.0 x 2.8 x 3.3 cm.

Left ovary

Measurements: 3.3 x 2.0 x 2.3 cm = volume: 8 mL. Normal
appearance/no adnexal mass.

Pulsed Doppler evaluation of both ovaries demonstrates normal
low-resistance arterial and venous waveforms.

Other findings

Mild simple appearing free fluid noted within the pelvis.
IMPRESSION: Heterogeneous, avascular, expansile material within the endocervical
canal. While this may simply represent a combination of blood and
debris within the endocervical canal, a hypovascular mass could
appear similarly. Correlation with clinical examination and possible
tissue sampling is recommended.

4 cm right ovarian benign functional cyst. No follow-up imaging is
recommended.

Reference: Radiology [DATE]):[id number]

## 2023-02-01 ENCOUNTER — Encounter: Payer: Self-pay | Admitting: Cardiology

## 2023-02-02 ENCOUNTER — Encounter (HOSPITAL_BASED_OUTPATIENT_CLINIC_OR_DEPARTMENT_OTHER): Payer: Self-pay | Admitting: Cardiovascular Disease

## 2023-02-02 NOTE — Procedures (Signed)
Patient Name: Bonnie Saunders, Bonnie Saunders Date: 01/21/2023 Gender: Female D.O.B: Sep 05, 1986 Age (years): 37 Referring Provider: Rollene Rotunda Height (inches): 59 Interpreting Physician: Nicki Guadalajara MD, ABSM Weight (lbs): 220 RPSGT: Ulyess Mort BMI: 44 MRN: [id number] Neck Size: 14.00  CLINICAL INFORMATION The patient is referred for a CPAP titration to treat sleep apnea.  Date of HST:  12/21/2022: AHI 16.1/h; O2 nadir 80%.  SLEEP STUDY TECHNIQUE As per the AASM Manual for the Scoring of Sleep and Associated Events v2.3 (April 2016) with a hypopnea requiring 4% desaturations.  The channels recorded and monitored were frontal, central and occipital EEG, electrooculogram (EOG), submentalis EMG (chin), nasal and oral airflow, thoracic and abdominal wall motion, anterior tibialis EMG, snore microphone, electrocardiogram, and pulse oximetry. Continuous positive airway pressure (CPAP) was initiated at the beginning of the study and titrated to treat sleep-disordered breathing.  MEDICATIONS albuterol (VENTOLIN HFA) 108 (90 Base) MCG/ACT inhaler metFORMIN (GLUCOPHAGE) 850 MG tablet Multiple Vitamins-Calcium (ONE-A-DAY WOMENS FORMULA) TABS omeprazole (PRILOSEC) 20 MG capsule predniSONE (DELTASONE) 50 MG tablet pregabalin (LYRICA) 50 MG capsule promethazine-dextromethorphan (PROMETHAZINE-DM) 6.25-15 MG/5ML syrup sertraline (ZOLOFT) 25 MG tablet Medications self-administered by patient taken the night of the study : MONTELUKAST, TIZANIDINE  TECHNICIAN COMMENTS Comments added by technician: PATIENT WAS ORDERED AS A CPAP TITRATION. Comments added by scorer: N/A  RESPIRATORY PARAMETERS Optimal PAP Pressure (cm): 17 AHI at Optimal Pressure (/hr): 3.3 Overall Minimal O2 (%): 92.0 Supine % at Optimal Pressure (%): 0 Minimal O2 at Optimal Pressure (%): 94.0   SLEEP ARCHITECTURE The study was initiated at 9:47:31 PM and ended at 4:51:31 AM.  Sleep onset time was 104.9 minutes and  the sleep efficiency was 30.9%. The total sleep time was 131 minutes.  The patient spent 37.8% of the night in stage N1 sleep, 62.2% in stage N2 sleep, 0.0% in stage N3 and 0% in REM.Stage REM latency was N/A minutes  Wake after sleep onset was 188.1. Alpha intrusion was absent. Supine sleep was 0.00%.  CARDIAC DATA The 2 lead EKG demonstrated sinus rhythm. The mean heart rate was 61.0 beats per minute. Other EKG findings include: None.  LEG MOVEMENT DATA The total Periodic Limb Movements of Sleep (PLMS) were 0. The PLMS index was 0.0. A PLMS index of <15 is considered normal in adults.  IMPRESSIONS - CPAP was initiated at 5 cm and was titrated to optimal PAP pressure at 17 cm of water ( AHI 3.3/h; RDI 3.3; O2 nadir 94%). AHI at 15 cm was 0 with RDI 34.7, O2 nadir 94%.  - Significant oxygen desaturations were not observed during this titration (min O2 92.0%). - The patient snored with moderate snoring volume during this titration study. Snoring resolved at 13 cm of water. - No significant cardiac abnormalities were observed during this study; rare PAC. - Clinically significant periodic limb movements were not noted during this study. Arousals associated with PLMs were rare.  DIAGNOSIS - Obstructive Sleep Apnea (G47.33)  RECOMMENDATIONS - Recommend an initial trial of CPAP Auto with EPR of 3 at 15 - 20 cm of water with heated humidification.  A X-Small size Resmed Full Face AirFit F10 for Her mask was used for the titration. - Effort should be made to optimize nasal and oropharyngeal patency. - Avoid alcohol, sedatives and other CNS depressants that may worsen sleep apnea and disrupt normal sleep architecture. - Sleep hygiene should be reviewed to assess factors that may improve sleep quality. - Weight management (BMI 44) and regular exercise should be  initiated or continued. - Recommend a download and sleep clinic evaluation after 4 - 6 weeks of therapy.  [Electronically signed]  02/02/2023 09:56 AM  Nicki Guadalajarahomas Ignace Mandigo MD, [facility], ABSM Diplomate, American Board of Sleep Medicine  NPI: [id number]  [facility] PH: [phone]   FX: [phone] ACCREDITED BY THE AMERICAN ACADEMY OF SLEEP MEDICINE

## 2023-02-07 ENCOUNTER — Telehealth: Payer: Self-pay | Admitting: *Deleted

## 2023-02-07 NOTE — Telephone Encounter (Signed)
Left message to return a call to inform sleep study completed. CPAP order has been sent to [facility] via [employer] portal.

## 2023-02-18 ENCOUNTER — Ambulatory Visit (INDEPENDENT_AMBULATORY_CARE_PROVIDER_SITE_OTHER): Payer: BC Managed Care – PPO

## 2023-02-18 ENCOUNTER — Ambulatory Visit
Admission: EM | Admit: 2023-02-18 | Discharge: 2023-02-18 | Disposition: A | Discharge status: Home / Self Care | Payer: BC Managed Care – PPO | Attending: Urgent Care | Admitting: Urgent Care

## 2023-02-18 DIAGNOSIS — J310 Chronic rhinitis: Secondary | ICD-10-CM | POA: Diagnosis not present

## 2023-02-18 DIAGNOSIS — B349 Viral infection, unspecified: Secondary | ICD-10-CM | POA: Insufficient documentation

## 2023-02-18 DIAGNOSIS — Z79899 Other long term (current) drug therapy: Secondary | ICD-10-CM | POA: Diagnosis not present

## 2023-02-18 DIAGNOSIS — J069 Acute upper respiratory infection, unspecified: Secondary | ICD-10-CM | POA: Diagnosis not present

## 2023-02-18 DIAGNOSIS — J4530 Mild persistent asthma, uncomplicated: Secondary | ICD-10-CM | POA: Diagnosis present

## 2023-02-18 DIAGNOSIS — Z1152 Encounter for screening for COVID-19: Secondary | ICD-10-CM | POA: Insufficient documentation

## 2023-02-18 MED ORDER — CETIRIZINE HCL 10 MG PO TABS: 10.0000 mg | ORAL_TABLET | Freq: Every day | ORAL | 0 refills | Status: AC

## 2023-02-18 MED ORDER — PREDNISONE 50 MG PO TABS: 50.0000 mg | ORAL_TABLET | Freq: Every day | ORAL | 0 refills | Status: AC

## 2023-02-18 MED ORDER — ALBUTEROL SULFATE HFA 108 (90 BASE) MCG/ACT IN AERS: 1.0000 | INHALATION_SPRAY | RESPIRATORY_TRACT | 0 refills | Status: AC | PRN

## 2023-02-18 MED ORDER — PROMETHAZINE-DM 6.25-15 MG/5ML PO SYRP: 5.0000 mL | ORAL_SOLUTION | Freq: Three times a day (TID) | ORAL | 0 refills | Status: AC | PRN

## 2023-02-18 NOTE — ED Triage Notes (Signed)
Pt reports wheezing, cough, nasal congestion and chest congestion x 5 days. Robitussin and Coricidin

## 2023-02-18 NOTE — ED Provider Notes (Signed)
[facility]  Note:  This document was prepared using Conservation officer, historic buildings and may include unintentional dictation errors.  MRN: [id number] DOB: [birth year]  Subjective:   Bonnie Saunders is a 37 y.o. female presenting for 5-day history of acute onset persistent sinus congestion, coughing, chest congestion, wheezing, shortness of breath.  Patient has asthma, advised to use her albuterol inhaler at night ago.  No smoking of any kind including cigarettes, cigars, vaping, marijuana use.    No current facility-administered medications for this encounter.  Current Outpatient Medications:    montelukast (SINGULAIR) 10 MG tablet, SMARTSIG:1 Tablet(s) By Mouth Every Evening, Disp: , Rfl:    albuterol (VENTOLIN HFA) 108 (90 Base) MCG/ACT inhaler, Inhale 1 puff into the lungs every 4 (four) hours as needed for wheezing or shortness of breath., Disp: , Rfl:    DULoxetine (CYMBALTA) 60 MG capsule, Take 60 mg by mouth daily., Disp: , Rfl:    metFORMIN (GLUCOPHAGE) 850 MG tablet, Take 850 mg by mouth daily with breakfast., Disp: , Rfl:    Multiple Vitamins-Calcium (ONE-A-DAY WOMENS FORMULA) TABS, Take 1 tablet by mouth daily., Disp: , Rfl:    omeprazole (PRILOSEC) 20 MG capsule, Take 20 mg by mouth daily., Disp: , Rfl:    predniSONE (DELTASONE) 50 MG tablet, Take 1 tablet (50 mg total) by mouth daily with breakfast., Disp: 5 tablet, Rfl: 0   pregabalin (LYRICA) 50 MG capsule, Take 50 mg by mouth 2 (two) times daily., Disp: , Rfl:    promethazine-dextromethorphan (PROMETHAZINE-DM) 6.25-15 MG/5ML syrup, Take 2.5 mLs by mouth 3 (three) times daily as needed for cough., Disp: 100 mL, Rfl: 0   sertraline (ZOLOFT) 25 MG tablet, Take 25 mg by mouth daily., Disp: , Rfl:    verapamil (CALAN-SR) 180 MG CR tablet, Take 180 mg by mouth daily., Disp: , Rfl:    Allergies  Allergen Reactions   Acetaminophen-Caffeine Itching    Elevated blood pressure  Elevated blood  pressure  Elevated blood pressure  Elevated blood pressure  Elevated blood pressure  Elevated blood pressure   Meloxicam Palpitations   Peanut (Diagnostic) Anaphylaxis   Peanut Oil Anaphylaxis and Hives   Fish Allergy Other (See Comments)    Blisters on tongue   Other Swelling   Germanium Itching   Norco [Hydrocodone-Acetaminophen] Hives   Penicillins Hives   Skelaxin [Metaxalone] Hives    Past Medical History:  Diagnosis Date   Asthma    Fibromyalgia    Goiter    Hypertension    IBS (irritable bowel syndrome)      Past Surgical History:  Procedure Laterality Date   None      Family History  Problem Relation Age of Onset   Diabetes Mother    Hypertension Mother    Diabetes Father    Hypertension Father     Social History   Tobacco Use   Smoking status: Former    Types: Cigarettes   Smokeless tobacco: Never   Tobacco comments:    Quit for 3 years  Vaping Use   Vaping Use: Never used  Substance Use Topics   Alcohol use: Yes    Comment: occ   Drug use: Never    ROS   Objective:   Vitals: BP (!) 158/96 (BP Location: Right Arm)   Pulse (!) 111   Temp 98.9 F (37.2 C) (Oral)   Resp 19   LMP  (Within Weeks) Comment: 2 weeks  SpO2 93%   Physical Exam  Constitutional:      General: She is not in acute distress.    Appearance: Normal appearance. She is well-developed and normal weight. She is not ill-appearing, toxic-appearing or diaphoretic.  HENT:     Head: Normocephalic and atraumatic.     Right Ear: Tympanic membrane, ear canal and external ear normal. No drainage or tenderness. No middle ear effusion. There is no impacted cerumen. Tympanic membrane is not erythematous or bulging.     Left Ear: Tympanic membrane, ear canal and external ear normal. No drainage or tenderness.  No middle ear effusion. There is no impacted cerumen. Tympanic membrane is not erythematous or bulging.     Nose: Congestion present. No rhinorrhea.     Mouth/Throat:      Mouth: Mucous membranes are moist. No oral lesions.     Pharynx: No pharyngeal swelling, oropharyngeal exudate, posterior oropharyngeal erythema or uvula swelling.     Tonsils: No tonsillar exudate or tonsillar abscesses.  Eyes:     General: No scleral icterus.       Right eye: No discharge.        Left eye: No discharge.     Extraocular Movements: Extraocular movements intact.     Right eye: Normal extraocular motion.     Left eye: Normal extraocular motion.     Conjunctiva/sclera: Conjunctivae normal.  Cardiovascular:     Rate and Rhythm: Normal rate and regular rhythm.     Heart sounds: Normal heart sounds. No murmur heard.    No friction rub. No gallop.  Pulmonary:     Effort: Pulmonary effort is normal. No respiratory distress.     Breath sounds: No stridor. No wheezing, rhonchi or rales.  Chest:     Chest wall: No tenderness.  Musculoskeletal:     Cervical back: Normal range of motion and neck supple.  Lymphadenopathy:     Cervical: No cervical adenopathy.  Skin:    General: Skin is warm and dry.  Neurological:     General: No focal deficit present.     Mental Status: She is alert and oriented to person, place, and time.  Psychiatric:        Mood and Affect: Mood normal.        Behavior: Behavior normal.     DG Chest 2 View  Result Date: 02/18/2023 CLINICAL DATA:  Cough and shortness of breath.  Nasal congestion. EXAM: CHEST - 2 VIEW COMPARISON:  11/17/2022 FINDINGS: The lungs are clear without focal pneumonia, edema, pneumothorax or pleural effusion. The cardiopericardial silhouette is within normal limits for size. No acute bony abnormality. IMPRESSION: No active cardiopulmonary disease. Electronically Signed   By: Kennith Center M.D.   On: 02/18/2023 10:38     Assessment and Plan :   PDMP not reviewed this encounter.  1. Acute viral syndrome   2. Mild persistent asthma without complication    Recommended a oral prednisone course in the context of her asthma.   Refilled her albuterol inhaler.  Chest x-ray negative.  Will defer antibiotic use for now.  Will manage for viral illness such as viral URI, viral syndrome, viral rhinitis, COVID-19. Recommended supportive care. Offered scripts for symptomatic relief. Testing is pending. Counseled patient on potential for adverse effects with medications prescribed/recommended today, ER and return-to-clinic precautions discussed, patient verbalized understanding.     Wallis Bamberg, PA-C 02/18/23 1104

## 2023-02-18 NOTE — Discharge Instructions (Addendum)
We will notify you of your test results as they arrive and may take between about 24 hours.  I encourage you to sign up for MyChart if you have not already done so as this can be the easiest way for us to communicate results to you online or through a phone app.  Generally, we only contact you if it is a positive test result.  In the meantime, if you develop worsening symptoms including fever, chest pain, shortness of breath despite our current treatment plan then please report to the emergency room as this may be a sign of worsening status from possible viral infection.  Otherwise, we will manage this as a viral syndrome. For sore throat or cough try using a honey-based tea. Use 3 teaspoons of honey with juice squeezed from half lemon. Place shaved pieces of ginger into 1/2-1 cup of water and warm over stove top. Then mix the ingredients and repeat every 4 hours as needed. Please take Tylenol 500mg-650mg every 6 hours for aches and pains, fevers. Hydrate very well with at least 2 liters of water. Eat light meals such as soups to replenish electrolytes and soft fruits, veggies. Start an antihistamine like Zyrtec (10mg daily) for postnasal drainage, sinus congestion.  You can take this together with prednisone and albuterol.  Use the cough medications as needed.  

## 2023-02-19 LAB — SARS CORONAVIRUS 2 (TAT 6-24 HRS): SARS Coronavirus 2: NEGATIVE

## 2023-02-20 ENCOUNTER — Ambulatory Visit
Admission: EM | Admit: 2023-02-20 | Discharge: 2023-02-20 | Disposition: A | Discharge status: Home / Self Care | Payer: BC Managed Care – PPO | Attending: Nurse Practitioner | Admitting: Nurse Practitioner

## 2023-02-20 DIAGNOSIS — J0110 Acute frontal sinusitis, unspecified: Secondary | ICD-10-CM | POA: Diagnosis not present

## 2023-02-20 MED ORDER — IPRATROPIUM BROMIDE 0.03 % NA SOLN
2.0000 | Freq: Two times a day (BID) | NASAL | 0 refills | Status: AC
Start: 2023-02-20 — End: ?

## 2023-02-20 MED ORDER — DOXYCYCLINE HYCLATE 100 MG PO CAPS
100.0000 mg | ORAL_CAPSULE | Freq: Two times a day (BID) | ORAL | 0 refills | Status: AC
Start: 2023-02-20 — End: 2023-02-27

## 2023-02-20 NOTE — Discharge Instructions (Signed)
Start doxycycline twice daily for 7 days Atrovent nasal spray twice daily for congestion.  Continue prednisone as previously prescribed Continue allergy medication and your albuterol inhaler as needed Nasal rinses as tolerated Rest and fluids Please follow-up with your PCP if your symptoms do not improve Please go to the ER for any worsening symptoms

## 2023-02-20 NOTE — ED Triage Notes (Signed)
Pt c/o cough and congestion x1 week. Was seen at [facility] on 4/22 and prescribed multiple meds. Reports little to no relief with medications.

## 2023-02-20 NOTE — ED Provider Notes (Addendum)
[facility]    CSN: [id number] Arrival date & time: 02/20/23  1203      History   Chief Complaint Chief Complaint  Patient presents with   Cough   Nasal Congestion    HPI Bonnie Saunders is a 37 y.o. female  presents for evaluation of URI symptoms for 7 days. Patient reports associated symptoms of cough, congestion, wheezing. Denies N/V/D, fevers, ear pain, sore throat, body aches, shortness of breath. Patient does have a hx of asthma.  She does have an albuterol inhaler which she has been using with temporary improvement.  No smoking.  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  She was seen in urgent care on 4/22 for same symptoms.  Was diagnosed with a viral illness and was given prednisone, albuterol inhaler, and allergy medicine with cough syrup.  She had a chest x-ray at that time that was negative for pneumonia.  She reports she has had no improvement with these treatments.  Pt has no other concerns at this time.    Cough Associated symptoms: wheezing     Past Medical History:  Diagnosis Date   Asthma    Fibromyalgia    Goiter    Hypertension    IBS (irritable bowel syndrome)     Patient Active Problem List   Diagnosis Date Noted   OSA (obstructive sleep apnea) 01/21/2023   Precordial chest pain 11/26/2022   Essential hypertension 11/26/2022    Past Surgical History:  Procedure Laterality Date   None      OB History   No obstetric history on file.      Home Medications    Prior to Admission medications   Medication Sig Start Date End Date Taking? Authorizing Provider  doxycycline (VIBRAMYCIN) 100 MG capsule Take 1 capsule (100 mg total) by mouth 2 (two) times daily for 7 days. 02/20/23 02/27/23 Yes Radford Pax, NP  ipratropium (ATROVENT) 0.03 % nasal spray Place 2 sprays into both nostrils every 12 (twelve) hours. 02/20/23  Yes Radford Pax, NP  albuterol (VENTOLIN HFA) 108 (90 Base) MCG/ACT inhaler Inhale 1-2  puffs into the lungs every 4 (four) hours as needed for wheezing or shortness of breath. 02/18/23   Wallis Bamberg, PA-C  cetirizine (ZYRTEC ALLERGY) 10 MG tablet Take 1 tablet (10 mg total) by mouth daily. 02/18/23   Wallis Bamberg, PA-C  DULoxetine (CYMBALTA) 60 MG capsule Take 60 mg by mouth daily.    [provider]  metFORMIN (GLUCOPHAGE) 850 MG tablet Take 850 mg by mouth daily with breakfast. 02/27/21   [provider]  montelukast (SINGULAIR) 10 MG tablet SMARTSIG:1 Tablet(s) By Mouth Every Evening 02/08/23   [provider]  Multiple Vitamins-Calcium (ONE-A-DAY WOMENS FORMULA) TABS Take 1 tablet by mouth daily.    [provider]  omeprazole (PRILOSEC) 20 MG capsule Take 20 mg by mouth daily. 03/29/20   [provider]  predniSONE (DELTASONE) 50 MG tablet Take 1 tablet (50 mg total) by mouth daily with breakfast. 02/18/23   Wallis Bamberg, PA-C  pregabalin (LYRICA) 50 MG capsule Take 50 mg by mouth 2 (two) times daily. 12/17/19   [provider]  promethazine-dextromethorphan (PROMETHAZINE-DM) 6.25-15 MG/5ML syrup Take 5 mLs by mouth 3 (three) times daily as needed for cough. 02/18/23   Wallis Bamberg, PA-C  sertraline (ZOLOFT) 25 MG tablet Take 25 mg by mouth daily. 08/25/21   [provider]  verapamil (CALAN-SR) 180 MG CR  tablet Take 180 mg by mouth daily. 08/25/21   [provider]    Family History Family History  Problem Relation Age of Onset   Diabetes Mother    Hypertension Mother    Diabetes Father    Hypertension Father     Social History Social History   Tobacco Use   Smoking status: Former    Types: Cigarettes   Smokeless tobacco: Never   Tobacco comments:    Quit for 3 years  Vaping Use   Vaping Use: Never used  Substance Use Topics   Alcohol use: Yes    Comment: occ   Drug use: Never     Allergies   Acetaminophen-caffeine, Meloxicam, Peanut (diagnostic), Peanut oil, Fish allergy, Other, Germanium, Norco  [hydrocodone-acetaminophen], Penicillins, and Skelaxin [metaxalone]   Review of Systems Review of Systems  HENT:  Positive for congestion, sinus pressure and sinus pain.   Respiratory:  Positive for cough and wheezing.      Physical Exam Triage Vital Signs ED Triage Vitals  Enc Vitals Group     BP 02/20/23 1301 (!) 158/109     Pulse Rate 02/20/23 1259 89     Resp 02/20/23 1259 20     Temp 02/20/23 1259 98.9 F (37.2 C)     Temp src --      SpO2 02/20/23 1259 97 %     Weight --      Height --      Head Circumference --      Peak Flow --      Pain Score 02/20/23 1257 0     Pain Loc --      Pain Edu? --      Excl. in GC? --    No data found.  Updated Vital Signs BP (!) 158/109   Pulse 89   Temp 98.9 F (37.2 C)   Resp 20   LMP 01/29/2023 (Approximate)   SpO2 97%   Visual Acuity Right Eye Distance:   Left Eye Distance:   Bilateral Distance:    Right Eye Near:   Left Eye Near:    Bilateral Near:     Physical Exam Vitals and nursing note reviewed.  Constitutional:      General: She is not in acute distress.    Appearance: She is well-developed. She is not ill-appearing.  HENT:     Head: Normocephalic and atraumatic.     Right Ear: Tympanic membrane and ear canal normal.     Left Ear: Tympanic membrane and ear canal normal.     Nose: Congestion present.     Right Turbinates: Swollen.     Left Turbinates: Swollen.     Right Sinus: Frontal sinus tenderness present. No maxillary sinus tenderness.     Left Sinus: Frontal sinus tenderness present. No maxillary sinus tenderness.     Mouth/Throat:     Mouth: Mucous membranes are moist.     Pharynx: Oropharynx is clear. Uvula midline. No oropharyngeal exudate or posterior oropharyngeal erythema.     Tonsils: No tonsillar exudate or tonsillar abscesses.  Eyes:     Conjunctiva/sclera: Conjunctivae normal.     Pupils: Pupils are equal, round, and reactive to light.  Cardiovascular:     Rate and Rhythm: Normal  rate and regular rhythm.     Heart sounds: Normal heart sounds.  Pulmonary:     Effort: Pulmonary effort is normal.     Breath sounds: Normal breath sounds. No wheezing.  Musculoskeletal:  Cervical back: Normal range of motion and neck supple.  Lymphadenopathy:     Cervical: No cervical adenopathy.  Skin:    General: Skin is warm and dry.  Neurological:     General: No focal deficit present.     Mental Status: She is alert and oriented to person, place, and time.  Psychiatric:        Mood and Affect: Mood normal.        Behavior: Behavior normal.      UC Treatments / Results  Labs (all labs ordered are listed, but only abnormal results are displayed) Labs Reviewed - No data to display  EKG   Radiology No results found.  Procedures Procedures (including critical care time)  Medications Ordered in UC Medications - No data to display  Initial Impression / Assessment and Plan / UC Course  I have reviewed the triage vital signs and the nursing notes.  Pertinent labs & imaging results that were available during my care of the patient were reviewed by me and considered in my medical decision making (see chart for details).     Reviewed exam and symptoms with patient.  No red flags. Start doxycycline given length of symptoms Atrovent nasal spray as needed Continue prednisone as previously described as well as her albuterol inhaler and cough medicine Nasal rinses as tolerated PCP follow-up if symptoms do not improve ER precautions reviewed and patient verbalized understanding Final Clinical Impressions(s) / UC Diagnoses   Final diagnoses:  Acute frontal sinusitis, recurrence not specified     Discharge Instructions      Start doxycycline twice daily for 7 days Atrovent nasal spray twice daily for congestion.  Continue prednisone as previously prescribed Continue allergy medication and your albuterol inhaler as needed Nasal rinses as tolerated Rest and  fluids Please follow-up with your PCP if your symptoms do not improve Please go to the ER for any worsening symptoms   ED Prescriptions     Medication Sig Dispense Auth. Provider   ipratropium (ATROVENT) 0.03 % nasal spray Place 2 sprays into both nostrils every 12 (twelve) hours. 30 mL Radford Pax, NP   doxycycline (VIBRAMYCIN) 100 MG capsule Take 1 capsule (100 mg total) by mouth 2 (two) times daily for 7 days. 14 capsule Radford Pax, NP      PDMP not reviewed this encounter.   Radford Pax, NP 02/20/23 1326    Radford Pax, NP 02/20/23 1327

## 2023-07-19 IMAGING — CR DG CHEST 2V
2 series · 2 of 2 positions shown · non-contrast
Comparison: None Available.

CLINICAL DATA: COVID positive with shortness of breath, coughing
and chest pain.

EXAM:
CHEST - 2 VIEW

[chest pa]
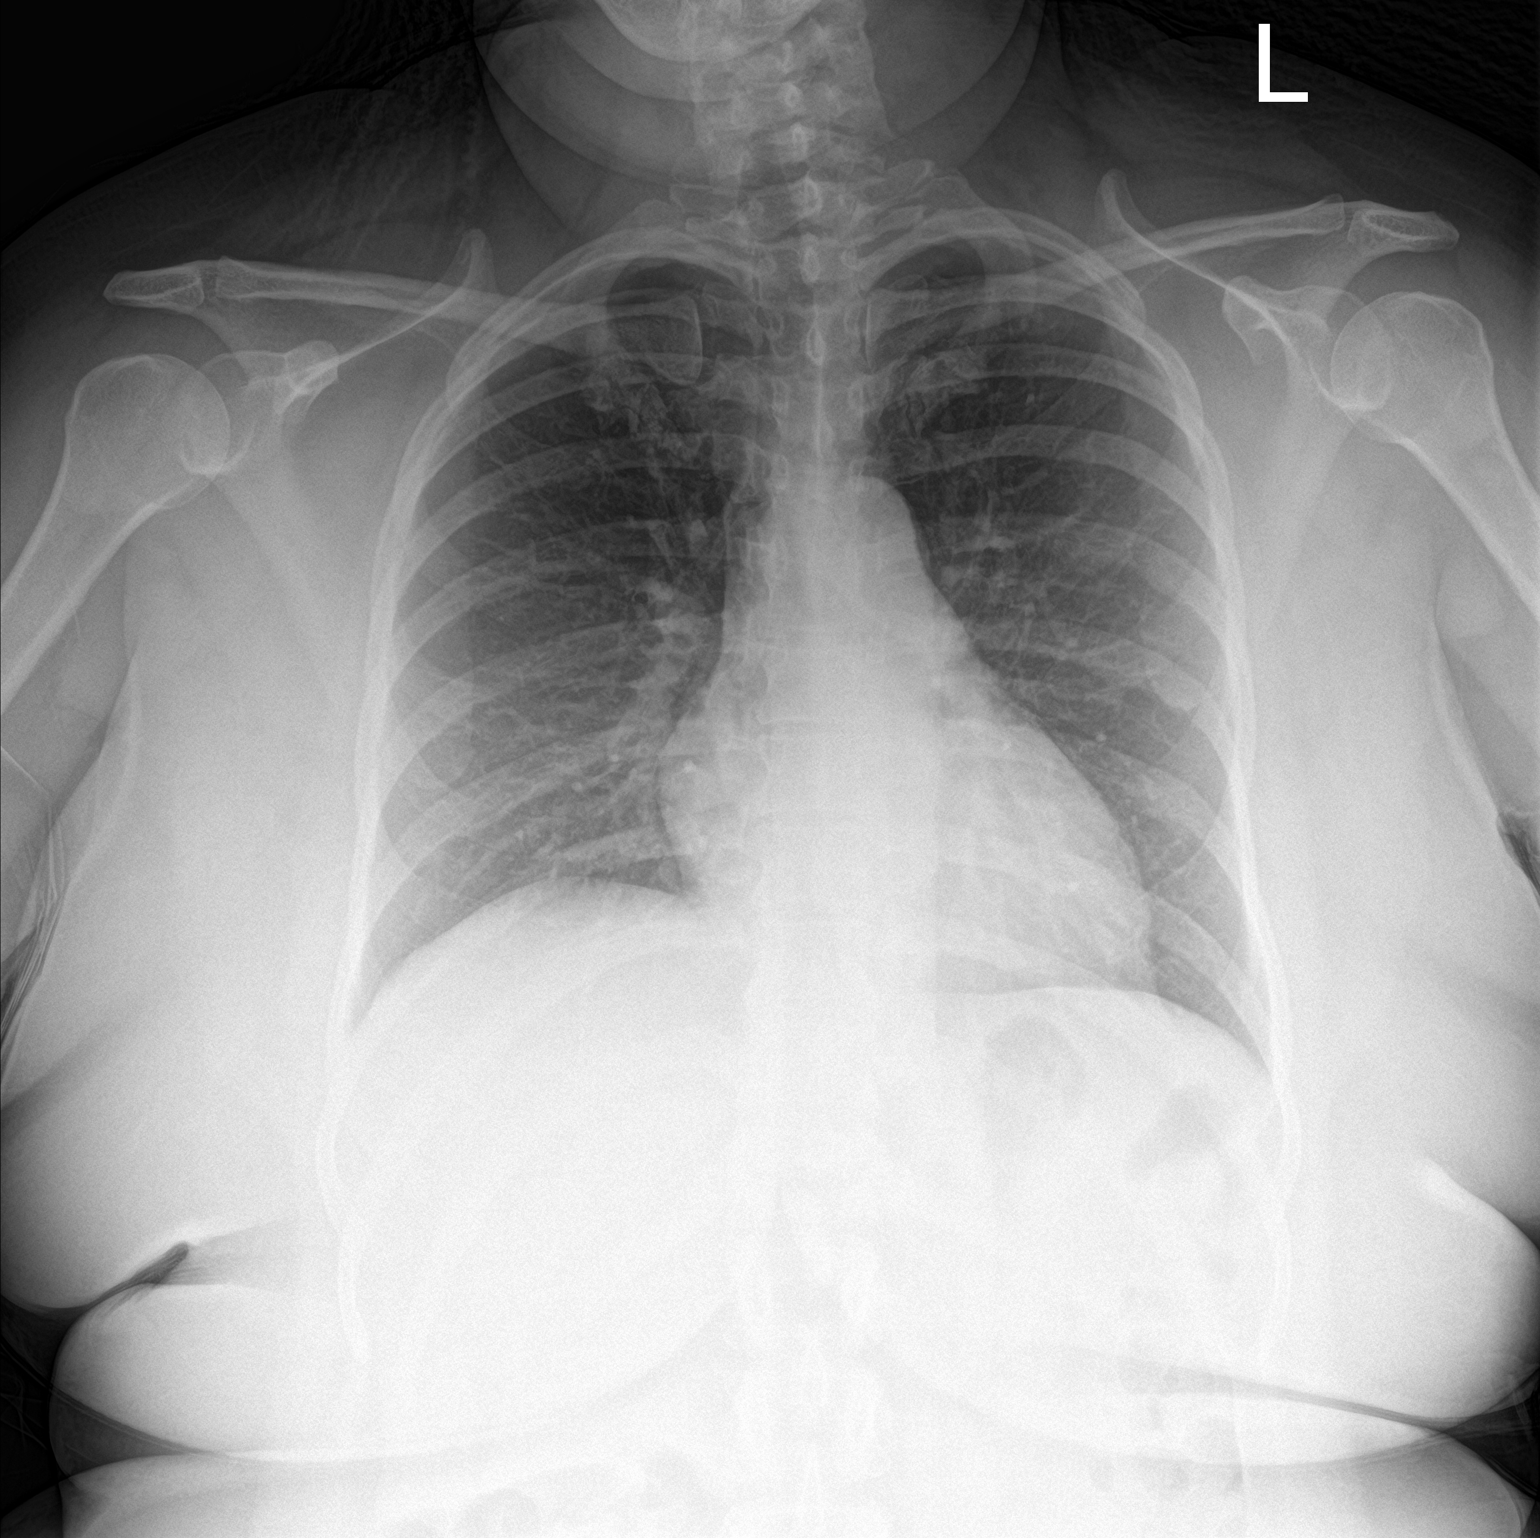

[chest lat]
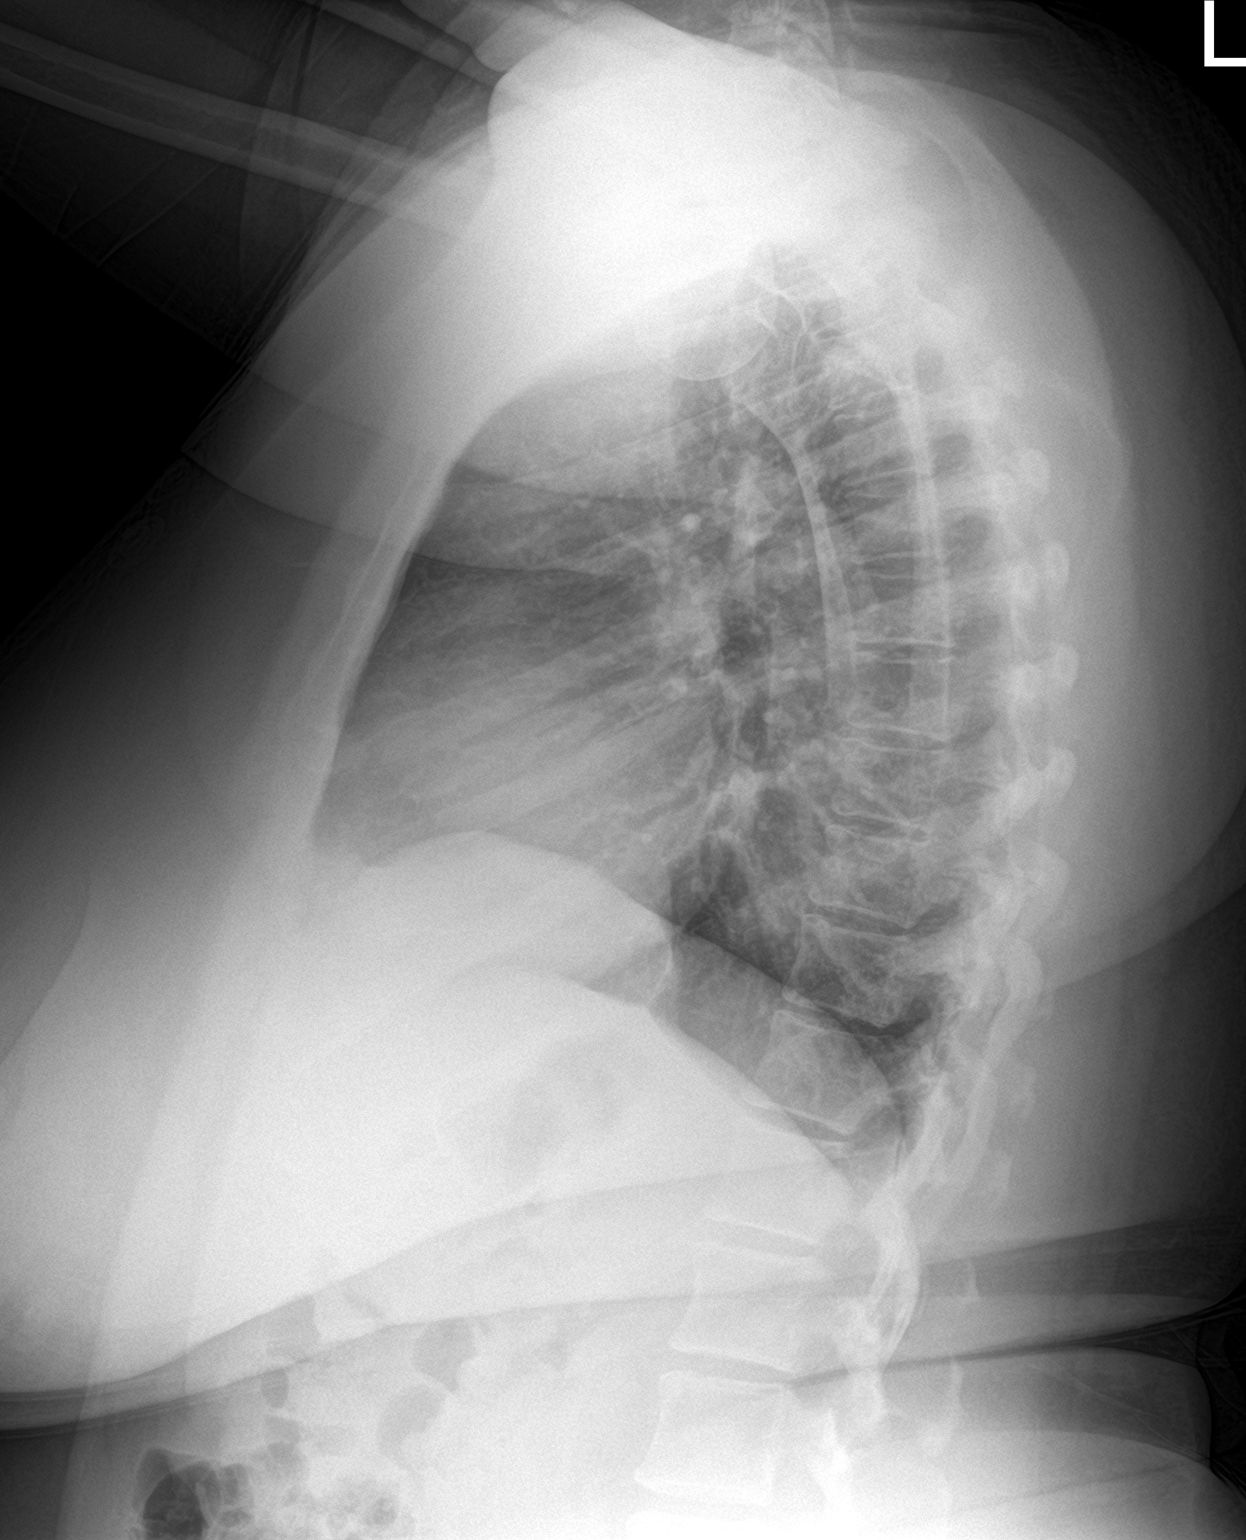

[2 of 2 positions shown; findings below may reference images not displayed]

FINDINGS: The heart size and mediastinal contours are within normal limits.
Both lungs are clear with mildly elevated right hemidiaphragm. The
visualized skeletal structures are unremarkable.
IMPRESSION: No active cardiopulmonary disease.
